# Patient Record
Sex: Male | Born: 1970 | Race: White | Hispanic: No | Smoking: Never smoker
Health system: Southern US, Community
[De-identification: ages and names within clinical notes are randomized; demographics above are authoritative.]

## PROBLEM LIST (undated history)

## (undated) DIAGNOSIS — I10 Essential (primary) hypertension: Secondary | ICD-10-CM

## (undated) HISTORY — PX: TONSILLECTOMY AND ADENOIDECTOMY: SUR1326

---

## 2011-12-27 ENCOUNTER — Encounter (HOSPITAL_COMMUNITY): Payer: Self-pay | Admitting: Family Medicine

## 2011-12-27 ENCOUNTER — Other Ambulatory Visit: Payer: Self-pay

## 2011-12-27 ENCOUNTER — Emergency Department (HOSPITAL_COMMUNITY): Payer: Self-pay

## 2011-12-27 ENCOUNTER — Inpatient Hospital Stay (HOSPITAL_COMMUNITY)
Admission: EM | Admit: 2011-12-27 | Discharge: 2011-12-30 | DRG: 419 | Disposition: A | Discharge status: Home / Self Care | Payer: Self-pay | Attending: General Surgery | Admitting: General Surgery

## 2011-12-27 DIAGNOSIS — Z6833 Body mass index (BMI) 33.0-33.9, adult: Secondary | ICD-10-CM

## 2011-12-27 DIAGNOSIS — K8000 Calculus of gallbladder with acute cholecystitis without obstruction: Principal | ICD-10-CM

## 2011-12-27 LAB — COMPREHENSIVE METABOLIC PANEL
Alkaline Phosphatase: 83 U/L (ref 39–117)
BUN: 13 mg/dL (ref 6–23)
GFR calc Af Amer: >90 mL/min (ref >90)
GFR calc non Af Amer: 85 mL/min — ABNORMAL LOW (ref >90)
Glucose, Bld: 129 mg/dL — ABNORMAL HIGH (ref 70–99)
Potassium: 3.4 mEq/L — ABNORMAL LOW (ref 3.5–5.1)
Total Bilirubin: 1.5 mg/dL — ABNORMAL HIGH (ref 0.3–1.2)
Total Protein: 7.7 g/dL (ref 6.0–8.3)

## 2011-12-27 LAB — CBC
Hemoglobin: 17.9 g/dL — ABNORMAL HIGH (ref 13.0–17.0)
MCH: 30.4 pg (ref 26.0–34.0)
MCV: 82.5 fL (ref 78.0–100.0)
RBC: 5.88 MIL/uL — ABNORMAL HIGH (ref 4.22–5.81)

## 2011-12-27 LAB — LIPASE, BLOOD: Lipase: 14 U/L (ref 11–59)

## 2011-12-27 LAB — DIFFERENTIAL
Eosinophils Absolute: 0 10*3/uL (ref 0.0–0.7)
Lymphs Abs: 1.3 10*3/uL (ref 0.7–4.0)
Monocytes Relative: 6 % (ref 3–12)
Neutrophils Relative %: 88 % — ABNORMAL HIGH (ref 43–77)

## 2011-12-27 MED ORDER — HYDROMORPHONE HCL PF 1 MG/ML IJ SOLN
1.0000 mg | Freq: Once | INTRAMUSCULAR | Status: AC
  Administered 2011-12-27: 1 mg via INTRAVENOUS
  Filled 2011-12-27: qty 1

## 2011-12-27 MED ORDER — SODIUM CHLORIDE 0.9 % IV BOLUS (SEPSIS)
1000.0000 mL | Freq: Once | INTRAVENOUS | Status: AC
  Administered 2011-12-27: 1000 mL via INTRAVENOUS

## 2011-12-27 MED ORDER — ONDANSETRON HCL 4 MG/2ML IJ SOLN
4.0000 mg | Freq: Once | INTRAMUSCULAR | Status: AC
  Administered 2011-12-27: 4 mg via INTRAVENOUS
  Filled 2011-12-27: qty 2

## 2011-12-27 NOTE — ED Notes (Signed)
Pt complaining of upper back pain and chest pain that started last night after eating some chili. sts now RUQ pain. Denies N,V,D.

## 2011-12-27 NOTE — ED Provider Notes (Signed)
History     CSN: [id number]  Arrival date & time 12/27/11  2130   First MD Initiated Contact with Patient 12/27/11 2103      Chief Complaint  Patient presents with  . Abdominal Pain  . Chest Pain  . Back Pain    (Consider location/radiation/quality/duration/timing/severity/associated sxs/prior treatment) HPI Comments: Patient presents with right upper quadrant pain has been constant throughout the day.  It is sharp and stabbing does not radiate. Denies any nausea, vomiting, diarrhea, fever. 2 nights ago he woke up with upper back pain and epigastric pain he attributes to eating some chili. Those pains are now resolved. He denies any previous medical problems and has no cardiac history.  The history is provided by the patient.    History reviewed. No pertinent past medical history.  Past Surgical History  Procedure Date  . Tonsillectomy and adenoidectomy     History reviewed. No pertinent family history.  History  Substance Use Topics  . Smoking status: Never Smoker   . Smokeless tobacco: Not on file  . Alcohol Use: 0.5 oz/week    1 drink(s) per week      Review of Systems  Constitutional: Positive for activity change. Negative for fever.  HENT: Negative for congestion and rhinorrhea.   Respiratory: Negative for cough, chest tightness and shortness of breath.   Cardiovascular: Positive for chest pain.  Gastrointestinal: Positive for abdominal pain. Negative for nausea, vomiting and diarrhea.  Genitourinary: Negative for dysuria and hematuria.  Musculoskeletal: Positive for back pain.  Skin: Negative for rash.  Neurological: Negative for weakness, light-headedness and headaches.    Allergies  Review of patient's allergies indicates no known allergies.  Home Medications  No current outpatient prescriptions on file.  BP 127/74  Pulse 96  Temp(Src) 100.5 F (38.1 C) (Oral)  Resp 18  Ht 6' (1.829 m)  Wt 250 lb (113.399 kg)  BMI 33.91 kg/m2  SpO2  96%  Physical Exam  Constitutional: He is oriented to person, place, and time. He appears well-developed and well-nourished. He appears distressed.  HENT:  Head: Normocephalic and atraumatic.  Mouth/Throat: Oropharynx is clear and moist. No oropharyngeal exudate.  Eyes: Conjunctivae and EOM are normal. Pupils are equal, round, and reactive to light.  Neck: Normal range of motion. Neck supple.  Cardiovascular: Normal rate, regular rhythm and normal heart sounds.   Pulmonary/Chest: Effort normal and breath sounds normal. No respiratory distress.  Abdominal: Soft. There is tenderness. There is rebound and guarding.       Right upper quadrant tenderness with guarding and rebound  Musculoskeletal: Normal range of motion. He exhibits no edema and no tenderness.  Neurological: He is alert and oriented to person, place, and time. No cranial nerve deficit.  Skin: Skin is warm.    ED Course  Procedures (including critical care time)  Labs Reviewed  CBC - Abnormal; Notable for the following:    WBC 23.7 (*)    RBC 5.88 (*)    Hemoglobin 17.9 (*)    MCHC 36.9 (*)    All other components within normal limits  DIFFERENTIAL - Abnormal; Notable for the following:    Neutrophils Relative 88 (*)    Neutro Abs 21.0 (*)    Lymphocytes Relative 5 (*)    Monocytes Absolute 1.5 (*)    All other components within normal limits  COMPREHENSIVE METABOLIC PANEL - Abnormal; Notable for the following:    Potassium 3.4 (*)    Glucose, Bld 129 (*)  Total Bilirubin 1.5 (*)    GFR calc non Af Amer 85 (*)    All other components within normal limits  D-DIMER, QUANTITATIVE - Abnormal; Notable for the following:    D-Dimer, Quant 0.59 (*)    All other components within normal limits  LIPASE, BLOOD  TROPONIN I  SURGICAL PCR SCREEN  COMPREHENSIVE METABOLIC PANEL  CBC   Dg Chest 2 View  12/27/2011  *RADIOLOGY REPORT*  Clinical Data: Right lower chest pain, especially on inspiration.  CHEST - 2 VIEW   Comparison: None.  Findings: The lungs are hypoexpanded; mild bibasilar opacities may reflect atelectasis or possibly pneumonia.  There is no evidence of pleural effusion or pneumothorax.  The heart is normal in size; the mediastinal contour is within normal limits.  No acute osseous abnormalities are seen.  IMPRESSION: Lungs significantly hypoexpanded; mild basilar opacities may reflect atelectasis or possibly pneumonia.  Original Report Authenticated By: Tonia Ghent, M.D.   US Abdomen Complete  12/28/2011  *RADIOLOGY REPORT*  Clinical Data:  Chest, abdomen and back pain.  COMPLETE ABDOMINAL ULTRASOUND  Comparison:  None.  Findings:  Gallbladder:  2.6 cm gallstone lodged in the gallbladder neck. Mild to moderate diffuse gallbladder wall thickening with a maximum thickness of 5.5 mm.  The patient was not tender over the gallbladder.  Common bile duct:  Poorly visualized, measuring 5.5 mm in maximum diameter proximally.  Liver:  Mildly hyperechoic and mildly heterogeneous.  IVC:  Poorly visualized.  Grossly normal proximally.  Pancreas:  Not visualized.  Spleen:  Normal, measuring 8.2 cm in length.  Right Kidney:  Normal, measuring 11.9 cm in length.  Left Kidney:  Normal, measuring 12.8 cm in length.  Abdominal aorta:  Obscured by overlying bowel gas.  IMPRESSION:  1.  Cholelithiasis. 2.  Mild diffuse gallbladder wall thickening.  In the absence of tenderness over the gallbladder, this is most likely due to chronic cholecystitis. 3.  Non-visualized pancreas and abdominal aorta.  Original Report Authenticated By: Darrol Angel, M.D.     No diagnosis found.    MDM  RUQ pain with leukocytosis.  Very uncomfortable with guarding and rebound.  Elevated WBC count.  LFTs wnl. [place] with multiple stones and GB wall thickening. Discussed with Dr. Andrey Campanile who will evaluate patient.     Glynn Octave, MD 12/28/11 1041

## 2011-12-27 NOTE — ED Notes (Signed)
Pt states he would like something to drink to be able to pee. Unable to give urine sample at this time

## 2011-12-28 ENCOUNTER — Inpatient Hospital Stay (HOSPITAL_COMMUNITY): Payer: Self-pay

## 2011-12-28 ENCOUNTER — Encounter (HOSPITAL_COMMUNITY): Payer: Self-pay | Admitting: Anesthesiology

## 2011-12-28 ENCOUNTER — Inpatient Hospital Stay (HOSPITAL_COMMUNITY): Payer: Self-pay | Admitting: Anesthesiology

## 2011-12-28 ENCOUNTER — Encounter (HOSPITAL_COMMUNITY): Payer: Self-pay | Admitting: General Surgery

## 2011-12-28 ENCOUNTER — Encounter (HOSPITAL_COMMUNITY): Admission: EM | Disposition: A | Discharge status: Home / Self Care | Payer: Self-pay | Source: Home / Self Care

## 2011-12-28 ENCOUNTER — Other Ambulatory Visit (INDEPENDENT_AMBULATORY_CARE_PROVIDER_SITE_OTHER): Payer: Self-pay | Admitting: General Surgery

## 2011-12-28 DIAGNOSIS — R112 Nausea with vomiting, unspecified: Secondary | ICD-10-CM

## 2011-12-28 DIAGNOSIS — K8000 Calculus of gallbladder with acute cholecystitis without obstruction: Secondary | ICD-10-CM | POA: Diagnosis present

## 2011-12-28 DIAGNOSIS — K810 Acute cholecystitis: Secondary | ICD-10-CM

## 2011-12-28 DIAGNOSIS — R1011 Right upper quadrant pain: Secondary | ICD-10-CM

## 2011-12-28 HISTORY — PX: CHOLECYSTECTOMY: SHX55

## 2011-12-28 LAB — SURGICAL PCR SCREEN: Staphylococcus aureus: NEGATIVE

## 2011-12-28 LAB — COMPREHENSIVE METABOLIC PANEL
ALT: 35 U/L (ref 0–53)
Alkaline Phosphatase: 93 U/L (ref 39–117)
CO2: 21 mEq/L (ref 19–32)
GFR calc Af Amer: >90 mL/min (ref >90)
Glucose, Bld: 98 mg/dL (ref 70–99)
Potassium: 3.7 mEq/L (ref 3.5–5.1)
Sodium: 139 mEq/L (ref 135–145)
Total Protein: 7.3 g/dL (ref 6.0–8.3)

## 2011-12-28 LAB — CBC
HCT: 50 % (ref 39.0–52.0)
MCH: 28.8 pg (ref 26.0–34.0)
MCHC: 34 g/dL (ref 30.0–36.0)
RDW: 14.5 % (ref 11.5–15.5)

## 2011-12-28 SURGERY — LAPAROSCOPIC CHOLECYSTECTOMY WITH INTRAOPERATIVE CHOLANGIOGRAM
Anesthesia: General | Site: Abdomen | Wound class: Clean Contaminated

## 2011-12-28 MED ORDER — LACTATED RINGERS IV SOLN
INTRAVENOUS | Status: DC | PRN
  Administered 2011-12-28: 10:00:00 via INTRAVENOUS

## 2011-12-28 MED ORDER — FENTANYL CITRATE 0.05 MG/ML IJ SOLN
INTRAMUSCULAR | Status: DC | PRN
  Administered 2011-12-28 (×2): 50 ug via INTRAVENOUS
  Administered 2011-12-28: 150 ug via INTRAVENOUS

## 2011-12-28 MED ORDER — BIOTENE DRY MOUTH MT LIQD
15.0000 mL | Freq: Two times a day (BID) | OROMUCOSAL | Status: DC
  Administered 2011-12-28: 15 mL via OROMUCOSAL

## 2011-12-28 MED ORDER — CHLORHEXIDINE GLUCONATE 0.12 % MT SOLN
15.0000 mL | Freq: Two times a day (BID) | OROMUCOSAL | Status: DC
  Administered 2011-12-28: 15 mL via OROMUCOSAL

## 2011-12-28 MED ORDER — HYDROMORPHONE HCL PF 1 MG/ML IJ SOLN
0.2500 mg | INTRAMUSCULAR | Status: DC | PRN
  Administered 2011-12-28 (×4): 0.5 mg via INTRAVENOUS

## 2011-12-28 MED ORDER — ONDANSETRON HCL 4 MG/2ML IJ SOLN
INTRAMUSCULAR | Status: DC | PRN
  Administered 2011-12-28: 4 mg via INTRAVENOUS

## 2011-12-28 MED ORDER — 0.9 % SODIUM CHLORIDE (POUR BTL) OPTIME
TOPICAL | Status: DC | PRN
  Administered 2011-12-28: 1000 mL

## 2011-12-28 MED ORDER — ROCURONIUM BROMIDE 100 MG/10ML IV SOLN
INTRAVENOUS | Status: DC | PRN
  Administered 2011-12-28: 50 mg via INTRAVENOUS

## 2011-12-28 MED ORDER — KCL IN DEXTROSE-NACL 20-5-0.45 MEQ/L-%-% IV SOLN
INTRAVENOUS | Status: DC
  Administered 2011-12-28 – 2011-12-29 (×4): via INTRAVENOUS
  Filled 2011-12-28 (×8): qty 1000

## 2011-12-28 MED ORDER — LIDOCAINE-EPINEPHRINE 1 %-1:100000 IJ SOLN
INTRAMUSCULAR | Status: DC | PRN
  Administered 2011-12-28: 30 mL

## 2011-12-28 MED ORDER — HEPARIN SODIUM (PORCINE) 5000 UNIT/ML IJ SOLN
5000.0000 [IU] | Freq: Three times a day (TID) | INTRAMUSCULAR | Status: DC
  Administered 2011-12-28 – 2011-12-30 (×5): 5000 [IU] via SUBCUTANEOUS
  Filled 2011-12-28 (×8): qty 1

## 2011-12-28 MED ORDER — HYDROMORPHONE HCL PF 1 MG/ML IJ SOLN
INTRAMUSCULAR | Status: AC
  Filled 2011-12-28: qty 1

## 2011-12-28 MED ORDER — ONDANSETRON HCL 4 MG/2ML IJ SOLN: 4.0000 mg | Freq: Four times a day (QID) | INTRAMUSCULAR | Status: DC | PRN

## 2011-12-28 MED ORDER — BUPIVACAINE HCL (PF) 0.25 % IJ SOLN
INTRAMUSCULAR | Status: DC | PRN
  Administered 2011-12-28: 30 mL

## 2011-12-28 MED ORDER — GLYCOPYRROLATE 0.2 MG/ML IJ SOLN
INTRAMUSCULAR | Status: DC | PRN
  Administered 2011-12-28: .6 mg via INTRAVENOUS

## 2011-12-28 MED ORDER — IOHEXOL 300 MG/ML  SOLN
INTRAMUSCULAR | Status: DC | PRN
  Administered 2011-12-28: 50 mL via INTRAVENOUS

## 2011-12-28 MED ORDER — MORPHINE SULFATE 2 MG/ML IJ SOLN
2.0000 mg | INTRAMUSCULAR | Status: DC | PRN
  Administered 2011-12-28: 4 mg via INTRAVENOUS
  Administered 2011-12-28: 2 mg via INTRAVENOUS
  Administered 2011-12-28: 4 mg via INTRAVENOUS
  Administered 2011-12-28: 2 mg via INTRAVENOUS
  Administered 2011-12-28 (×3): 4 mg via INTRAVENOUS
  Administered 2011-12-29 (×2): 2 mg via INTRAVENOUS
  Filled 2011-12-28 (×2): qty 1
  Filled 2011-12-28 (×4): qty 2
  Filled 2011-12-28: qty 1
  Filled 2011-12-28: qty 2
  Filled 2011-12-28: qty 1

## 2011-12-28 MED ORDER — MIDAZOLAM HCL 5 MG/5ML IJ SOLN
INTRAMUSCULAR | Status: DC | PRN
  Administered 2011-12-28: 2 mg via INTRAVENOUS

## 2011-12-28 MED ORDER — PANTOPRAZOLE SODIUM 40 MG IV SOLR
40.0000 mg | Freq: Every day | INTRAVENOUS | Status: DC
  Administered 2011-12-28 – 2011-12-29 (×2): 40 mg via INTRAVENOUS
  Filled 2011-12-28 (×3): qty 40

## 2011-12-28 MED ORDER — PIPERACILLIN-TAZOBACTAM 3.375 G IVPB
3.3750 g | Freq: Three times a day (TID) | INTRAVENOUS | Status: DC
  Administered 2011-12-28 (×2): 3.375 g via INTRAVENOUS
  Filled 2011-12-28 (×3): qty 50

## 2011-12-28 MED ORDER — SODIUM CHLORIDE 0.9 % IR SOLN
Status: DC | PRN
  Administered 2011-12-28: 1000 mL

## 2011-12-28 MED ORDER — SUCCINYLCHOLINE CHLORIDE 20 MG/ML IJ SOLN
INTRAMUSCULAR | Status: DC | PRN
  Administered 2011-12-28: 100 mg via INTRAVENOUS

## 2011-12-28 MED ORDER — HYDROMORPHONE HCL PF 1 MG/ML IJ SOLN
1.0000 mg | Freq: Once | INTRAMUSCULAR | Status: AC
  Administered 2011-12-28: 1 mg via INTRAVENOUS
  Filled 2011-12-28: qty 1

## 2011-12-28 MED ORDER — HYDROCODONE-ACETAMINOPHEN 5-325 MG PO TABS
1.0000 | ORAL_TABLET | ORAL | Status: DC | PRN
  Administered 2011-12-29 (×3): 2 via ORAL
  Administered 2011-12-30: 1 via ORAL
  Filled 2011-12-28 (×2): qty 2
  Filled 2011-12-28: qty 1
  Filled 2011-12-28: qty 2

## 2011-12-28 MED ORDER — ONDANSETRON HCL 4 MG/2ML IJ SOLN: 4.0000 mg | Freq: Once | INTRAMUSCULAR | Status: AC | PRN

## 2011-12-28 MED ORDER — CIPROFLOXACIN IN D5W 400 MG/200ML IV SOLN
400.0000 mg | Freq: Two times a day (BID) | INTRAVENOUS | Status: DC
  Administered 2011-12-28 – 2011-12-30 (×4): 400 mg via INTRAVENOUS
  Filled 2011-12-28 (×5): qty 200

## 2011-12-28 MED ORDER — NEOSTIGMINE METHYLSULFATE 1 MG/ML IJ SOLN
INTRAMUSCULAR | Status: DC | PRN
  Administered 2011-12-28: 4 mg via INTRAVENOUS

## 2011-12-28 MED ORDER — KCL IN DEXTROSE-NACL 20-5-0.45 MEQ/L-%-% IV SOLN
INTRAVENOUS | Status: AC
  Filled 2011-12-28: qty 1000

## 2011-12-28 MED ORDER — PROPOFOL 10 MG/ML IV EMUL
INTRAVENOUS | Status: DC | PRN
  Administered 2011-12-28: 180 mg via INTRAVENOUS

## 2011-12-28 SURGICAL SUPPLY — 52 items
APPLIER CLIP ROT 10 11.4 M/L (STAPLE) ×2
BLADE SURG ROTATE 9660 (MISCELLANEOUS) IMPLANT
CANISTER SUCTION 2500CC (MISCELLANEOUS) ×2 IMPLANT
CATH REDDICK CHOLANGI 4FR 50CM (CATHETERS) ×2 IMPLANT
CHLORAPREP W/TINT 26ML (MISCELLANEOUS) ×2 IMPLANT
CLIP APPLIE ROT 10 11.4 M/L (STAPLE) ×1 IMPLANT
CLOTH BEACON ORANGE TIMEOUT ST (SAFETY) ×2 IMPLANT
COVER SURGICAL LIGHT HANDLE (MISCELLANEOUS) ×2 IMPLANT
DECANTER SPIKE VIAL GLASS SM (MISCELLANEOUS) ×4 IMPLANT
DERMABOND ADVANCED (GAUZE/BANDAGES/DRESSINGS) ×1
DERMABOND ADVANCED .7 DNX12 (GAUZE/BANDAGES/DRESSINGS) ×1 IMPLANT
DRAIN CHANNEL 19F RND (DRAIN) ×2 IMPLANT
DRAPE C-ARM 42X72 X-RAY (DRAPES) ×2 IMPLANT
ELECT CAUTERY BLADE 6.4 (BLADE) ×2 IMPLANT
ELECT REM PT RETURN 9FT ADLT (ELECTROSURGICAL) ×2
ELECTRODE REM PT RTRN 9FT ADLT (ELECTROSURGICAL) ×1 IMPLANT
EVACUATOR SILICONE 100CC (DRAIN) ×2 IMPLANT
GLOVE BIO SURGEON STRL SZ7.5 (GLOVE) ×2 IMPLANT
GLOVE BIOGEL PI IND STRL 7.0 (GLOVE) ×2 IMPLANT
GLOVE BIOGEL PI IND STRL 7.5 (GLOVE) ×1 IMPLANT
GLOVE BIOGEL PI IND STRL 8 (GLOVE) ×1 IMPLANT
GLOVE BIOGEL PI INDICATOR 7.0 (GLOVE) ×2
GLOVE BIOGEL PI INDICATOR 7.5 (GLOVE) ×1
GLOVE BIOGEL PI INDICATOR 8 (GLOVE) ×1
GLOVE ECLIPSE 6.5 STRL STRAW (GLOVE) ×2 IMPLANT
GLOVE SS BIOGEL STRL SZ 8 (GLOVE) ×1 IMPLANT
GLOVE SUPERSENSE BIOGEL SZ 8 (GLOVE) ×1
GLOVE SURG SS PI 6.5 STRL IVOR (GLOVE) ×2 IMPLANT
GLOVE SURG SS PI 7.5 STRL IVOR (GLOVE) ×4 IMPLANT
GOWN PREVENTION PLUS XLARGE (GOWN DISPOSABLE) ×4 IMPLANT
GOWN STRL NON-REIN LRG LVL3 (GOWN DISPOSABLE) ×6 IMPLANT
IV CATH 14GX2 1/4 (CATHETERS) ×2 IMPLANT
KIT BASIN OR (CUSTOM PROCEDURE TRAY) ×2 IMPLANT
KIT ROOM TURNOVER OR (KITS) ×2 IMPLANT
NS IRRIG 1000ML POUR BTL (IV SOLUTION) ×2 IMPLANT
PAD ARMBOARD 7.5X6 YLW CONV (MISCELLANEOUS) ×2 IMPLANT
PENCIL BUTTON HOLSTER BLD 10FT (ELECTRODE) ×2 IMPLANT
POUCH SPECIMEN RETRIEVAL 10MM (ENDOMECHANICALS) ×2 IMPLANT
SCISSORS LAP 5X35 DISP (ENDOMECHANICALS) IMPLANT
SET IRRIG TUBING LAPAROSCOPIC (IRRIGATION / IRRIGATOR) ×2 IMPLANT
SLEEVE ENDOPATH XCEL 5M (ENDOMECHANICALS) ×2 IMPLANT
SPECIMEN JAR SMALL (MISCELLANEOUS) ×2 IMPLANT
SUT ETHILON 2 0 FS 18 (SUTURE) ×2 IMPLANT
SUT MNCRL AB 4-0 PS2 18 (SUTURE) ×4 IMPLANT
SUT VICRYL 0 UR6 27IN ABS (SUTURE) ×4 IMPLANT
TOWEL OR 17X24 6PK STRL BLUE (TOWEL DISPOSABLE) ×2 IMPLANT
TOWEL OR 17X26 10 PK STRL BLUE (TOWEL DISPOSABLE) ×2 IMPLANT
TRAY FOLEY CATH 14FR (SET/KITS/TRAYS/PACK) ×2 IMPLANT
TRAY LAPAROSCOPIC (CUSTOM PROCEDURE TRAY) ×2 IMPLANT
TROCAR HASSON GELL 12X100 (TROCAR) ×2 IMPLANT
TROCAR XCEL NON-BLD 11X100MML (ENDOMECHANICALS) ×2 IMPLANT
TROCAR XCEL NON-BLD 5MMX100MML (ENDOMECHANICALS) ×2 IMPLANT

## 2011-12-28 NOTE — ED Notes (Signed)
Pt o2 saturation 88-91 on RA. [place] at 2L/min placed on pt. O2 saturation raised to 94%

## 2011-12-28 NOTE — H&P (Addendum)
Joshua Long is an 41 y.o. male.   Chief Complaint: abdominal pain HPI: 41 year old Caucasian man presents to the emergency room complaining of abdominal pain since Saturday morning. He states that the pain initially started in his epigastric area but later moved to his right upper quadrant. The pain in his upper abdomen gradually eased off over several hours; however, the pain in his right upper side has been constant ever since yesterday afternoon. He describes it as a sharp stabbing pain. He denies any previous symptoms. He states that on Friday he cooked an extra hot batch of chili and he developed some mild upper bowel pain after eating a bowl. However it resolved. He states that he hasn't had much of an appetite today. He describes the pain as 8/10. He denies any fevers or chills. He did have some nausea yesterday morning but no vomiting. He denies any diarrhea, constipation, melena, hematochezia, or acholic stools. He denies any difficulty swallowing. He tried taking some Tylenol and drinking a glass of red wine. Neither of which reduced his abdominal pain.  He denies any significant medical history.  History reviewed. No pertinent past medical history.  Past Surgical History  Procedure Date  . Tonsillectomy and adenoidectomy     History reviewed. No pertinent family history. Social History:  reports that he has never smoked. He does not have any smokeless tobacco history on file. He reports that he drinks about .5 ounces of alcohol per week. He reports that he does not use illicit drugs.  Allergies: No Known Allergies  Medications Prior to Admission  Medication Dose Route Frequency Provider Last Rate Last Dose  . HYDROmorphone (DILAUDID) injection 1 mg  1 mg Intravenous Once Glynn Octave, MD   1 mg at 12/27/11 2117  . HYDROmorphone (DILAUDID) injection 1 mg  1 mg Intravenous Once Glynn Octave, MD   1 mg at 12/27/11 2250  . HYDROmorphone (DILAUDID) injection 1 mg  1 mg Intravenous  Once Glynn Octave, MD   1 mg at 12/28/11 0044  . ondansetron (ZOFRAN) injection 4 mg  4 mg Intravenous Once Glynn Octave, MD   4 mg at 12/27/11 2117  . sodium chloride 0.9 % bolus 1,000 mL  1,000 mL Intravenous Once Glynn Octave, MD   1,000 mL at 12/27/11 2203   No current outpatient prescriptions on file as of 12/27/2011.    Results for orders placed during the hospital encounter of 12/27/11 (from the past 48 hour(s))  CBC     Status: Abnormal   Collection Time   12/27/11  9:06 PM      Component Value Range Comment   WBC 23.7 (*) 4.0 - 10.5 (K/uL)    RBC 5.88 (*) 4.22 - 5.81 (MIL/uL)    Hemoglobin 17.9 (*) 13.0 - 17.0 (g/dL)    HCT 40.9  81.1 - 91.4 (%)    MCV 82.5  78.0 - 100.0 (fL)    MCH 30.4  26.0 - 34.0 (pg)    MCHC 36.9 (*) 30.0 - 36.0 (g/dL)    RDW 78.2  95.6 - 21.3 (%)    Platelets 164  150 - 400 (K/uL)   DIFFERENTIAL     Status: Abnormal   Collection Time   12/27/11  9:06 PM      Component Value Range Comment   Neutrophils Relative 88 (*) 43 - 77 (%)    Neutro Abs 21.0 (*) 1.7 - 7.7 (K/uL)    Lymphocytes Relative 5 (*) 12 - 46 (%)  Lymphs Abs 1.3  0.7 - 4.0 (K/uL)    Monocytes Relative 6  3 - 12 (%)    Monocytes Absolute 1.5 (*) 0.1 - 1.0 (K/uL)    Eosinophils Relative 0  0 - 5 (%)    Eosinophils Absolute 0.0  0.0 - 0.7 (K/uL)    Basophils Relative 0  0 - 1 (%)    Basophils Absolute 0.0  0.0 - 0.1 (K/uL)   COMPREHENSIVE METABOLIC PANEL     Status: Abnormal   Collection Time   12/27/11  9:06 PM      Component Value Range Comment   Sodium 135  135 - 145 (mEq/L)    Potassium 3.4 (*) 3.5 - 5.1 (mEq/L)    Chloride 99  96 - 112 (mEq/L)    CO2 23  19 - 32 (mEq/L)    Glucose, Bld 129 (*) 70 - 99 (mg/dL)    BUN 13  6 - 23 (mg/dL)    Creatinine, Ser 9.60  0.50 - 1.35 (mg/dL)    Calcium 9.8  8.4 - 10.5 (mg/dL)    Total Protein 7.7  6.0 - 8.3 (g/dL)    Albumin 4.0  3.5 - 5.2 (g/dL)    AST 21  0 - 37 (U/L)    ALT 38  0 - 53 (U/L)    Alkaline Phosphatase 83  39 -  117 (U/L)    Total Bilirubin 1.5 (*) 0.3 - 1.2 (mg/dL)    GFR calc non Af Amer 85 (*) >90 (mL/min)    GFR calc Af Amer >90  >90 (mL/min)   LIPASE, BLOOD     Status: Normal   Collection Time   12/27/11  9:06 PM      Component Value Range Comment   Lipase 14  11 - 59 (U/L)   TROPONIN I     Status: Normal   Collection Time   12/27/11  9:06 PM      Component Value Range Comment   Troponin I <0.30  <0.30 (ng/mL)   D-DIMER, QUANTITATIVE     Status: Abnormal   Collection Time   12/27/11  9:28 PM      Component Value Range Comment   D-Dimer, Quant 0.59 (*) 0.00 - 0.48 (ug/mL-FEU)    Dg Chest 2 View  12/27/2011  *RADIOLOGY REPORT*  Clinical Data: Right lower chest pain, especially on inspiration.  CHEST - 2 VIEW  Comparison: None.  Findings: The lungs are hypoexpanded; mild bibasilar opacities may reflect atelectasis or possibly pneumonia.  There is no evidence of pleural effusion or pneumothorax.  The heart is normal in size; the mediastinal contour is within normal limits.  No acute osseous abnormalities are seen.  IMPRESSION: Lungs significantly hypoexpanded; mild basilar opacities may reflect atelectasis or possibly pneumonia.  Original Report Authenticated By: Tonia Ghent, M.D.   US Abdomen Complete  12/28/2011  *RADIOLOGY REPORT*  Clinical Data:  Chest, abdomen and back pain.  COMPLETE ABDOMINAL ULTRASOUND  Comparison:  None.  Findings:  Gallbladder:  2.6 cm gallstone lodged in the gallbladder neck. Mild to moderate diffuse gallbladder wall thickening with a maximum thickness of 5.5 mm.  The patient was not tender over the gallbladder.  Common bile duct:  Poorly visualized, measuring 5.5 mm in maximum diameter proximally.  Liver:  Mildly hyperechoic and mildly heterogeneous.  IVC:  Poorly visualized.  Grossly normal proximally.  Pancreas:  Not visualized.  Spleen:  Normal, measuring 8.2 cm in length.  Right Kidney:  Normal, measuring 11.9 cm  in length.  Left Kidney:  Normal, measuring 12.8 cm in  length.  Abdominal aorta:  Obscured by overlying bowel gas.  IMPRESSION:  1.  Cholelithiasis. 2.  Mild diffuse gallbladder wall thickening.  In the absence of tenderness over the gallbladder, this is most likely due to chronic cholecystitis. 3.  Non-visualized pancreas and abdominal aorta.  Original Report Authenticated By: Darrol Angel, M.D.    Review of Systems  Constitutional: Negative for fever, chills and weight loss.  HENT: Negative for hearing loss and neck pain.   Eyes: Negative for double vision.  Respiratory: Negative for shortness of breath.   Cardiovascular: Negative for chest pain, palpitations, orthopnea and leg swelling.  Gastrointestinal: Heartburn: see hpi.  Genitourinary: Negative for dysuria and urgency.  Musculoskeletal: Negative for back pain.  Skin: Negative for rash.  Neurological: Negative for dizziness, seizures, loss of consciousness, weakness and headaches.  Endo/Heme/Allergies: Negative for polydipsia.  Psychiatric/Behavioral: Negative for substance abuse.    Blood pressure 126/78, pulse 78, temperature 98.3 F (36.8 C), temperature source Oral, resp. rate 18, SpO2 99.00%. Physical Exam  Vitals reviewed. Constitutional: He is oriented to person, place, and time. He appears well-developed and well-nourished. No distress.  HENT:  Head: Normocephalic and atraumatic.  Right Ear: External ear normal.  Left Ear: External ear normal.  Nose: Nose normal.  Eyes: Conjunctivae are normal. No scleral icterus.  Neck: Normal range of motion. Neck supple. No tracheal deviation present. No thyromegaly present.  Cardiovascular: Normal rate, regular rhythm, normal heart sounds and intact distal pulses.   Respiratory: Effort normal and breath sounds normal. No respiratory distress. He has no wheezes.  GI: Soft. Bowel sounds are normal. He exhibits no distension. There is tenderness (RUQ). There is no rebound and no guarding.  Musculoskeletal: Normal range of motion. He  exhibits no edema and no tenderness.  Lymphadenopathy:    He has no cervical adenopathy.  Neurological: He is alert and oriented to person, place, and time.  Skin: Skin is warm and dry. No rash noted. No erythema.  Psychiatric: He has a normal mood and affect. His behavior is normal. Judgment and thought content normal.     Assessment/Plan Patient Active Problem List  Diagnoses  . Gallbladder calculus with acute cholecystitis  hypokalemia  Admit for IVF, IV abx, potassium replacement Plan Lap chole, possible open Monday by Dr Biagio Quint.  We discussed gallbladder disease. The patient was given Agricultural engineer. We discussed non-operative and operative management.   I discussed laparoscopic cholecystectomy in detail.  The patient was given diagrams detailing the procedure.  We discussed the risks and benefits of a laparoscopic cholecystectomy including, but not limited to bleeding, infection, injury to surrounding structures such as the intestine or liver, bile leak, retained gallstones, need to convert to an open procedure, prolonged diarrhea, blood clots such as  DVT, common bile duct injury, anesthesia risks, and possible need for additional procedures.  We discussed the typical post-operative recovery course. I explained that the likelihood of improvement of their symptoms is good.  Mary Sella. Andrey Campanile, MD, FACS General, Bariatric, & Minimally Invasive Surgery [facility], [place]    [facility] M 12/28/2011, 1:47 AM  Pt seen and evaluated on the surgical ward.  He has a history and exam consistent with acute cholecystitis.  He has focal RUQ tenderness with [place] concerning for cholecystitis.  WBC 24k.  I discussed the procedure and the alternatives with the patient.  The risks of infection, bleeding, pain, persistent symptoms, scarring, injury to  bowel or bile ducts, retained stone, diarrhea, need for additional procedures, and need for open surgery discussed with the patient. He  desires to proceed with lap chole with IOC.

## 2011-12-28 NOTE — Anesthesia Procedure Notes (Signed)
Procedure Name: Intubation Date/Time: 12/28/2011 10:55 AM Performed by: Leona Singleton A. Patient Re-evaluated:Patient Re-evaluated prior to inductionOxygen Delivery Method: Circle System Utilized Preoxygenation: Pre-oxygenation with 100% oxygen Intubation Type: IV induction, Rapid sequence and Cricoid Pressure applied Laryngoscope Size: Miller and 2 Grade View: Grade I Tube type: Oral Tube size: 7.5 mm Number of attempts: 1 Airway Equipment and Method: stylet Placement Confirmation: ETT inserted through vocal cords under direct vision,  positive ETCO2 and breath sounds checked- equal and bilateral Secured at: 21 cm Tube secured with: Tape Dental Injury: Teeth and Oropharynx as per pre-operative assessment

## 2011-12-28 NOTE — Preoperative (Signed)
Beta Blockers   Reason not to administer Beta Blockers:Not Applicable 

## 2011-12-28 NOTE — Anesthesia Preprocedure Evaluation (Addendum)
Anesthesia Evaluation  Patient identified by MRN, date of birth, ID band Patient awake    Reviewed: Allergy & Precautions, H&P , NPO status , Patient's Chart, lab work & pertinent test results  History of Anesthesia Complications Negative for: history of anesthetic complications  Airway Mallampati: II TM Distance: >3 FB Neck ROM: Full    Dental  (+) Teeth Intact and Dental Advisory Given   Pulmonary neg pulmonary ROS,  clear to auscultation        Cardiovascular Exercise Tolerance: Good neg cardio ROS Regular Normal    Neuro/Psych Negative Neurological ROS  Negative Psych ROS   GI/Hepatic negative GI ROS, Neg liver ROS,   Endo/Other  Negative Endocrine ROS  Renal/GU negative Renal ROS  Genitourinary negative   Musculoskeletal negative musculoskeletal ROS (+)   Abdominal (+) obese,  Abdomen: tender.    Peds negative pediatric ROS (+)  Hematology negative hematology ROS (+)   Anesthesia Other Findings   Reproductive/Obstetrics                        Anesthesia Physical Anesthesia Plan  ASA: II  Anesthesia Plan: General   Post-op Pain Management:    Induction: Intravenous  Airway Management Planned: Oral ETT  Additional Equipment:   Intra-op Plan:   Post-operative Plan: Extubation in OR  Informed Consent: I have reviewed the patients History and Physical, chart, labs and discussed the procedure including the risks, benefits and alternatives for the proposed anesthesia with the patient or authorized representative who has indicated his/her understanding and acceptance.   Dental advisory given  Plan Discussed with: Surgeon and CRNA  Anesthesia Plan Comments: (Choleliathiasis with biliary colic Obesity  Plan GA  Kipp Brood, MD)        Anesthesia Quick Evaluation

## 2011-12-28 NOTE — ED Notes (Signed)
Admitting MD at bedside.

## 2011-12-28 NOTE — Transfer of Care (Signed)
Immediate Anesthesia Transfer of Care Note  Patient: Joshua Long  Procedure(s) Performed: Procedure(s) (LRB): LAPAROSCOPIC CHOLECYSTECTOMY WITH INTRAOPERATIVE CHOLANGIOGRAM (N/A)  Patient Location: PACU  Anesthesia Type: General  Level of Consciousness: awake, alert , oriented and patient cooperative  Airway & Oxygen Therapy: Patient Spontanous Breathing and Patient connected to nasal cannula oxygen  Post-op Assessment: Report given to PACU RN, Post -op Vital signs reviewed and stable and Patient moving all extremities X 4  Post vital signs: Reviewed and stable  Complications: No apparent anesthesia complications

## 2011-12-28 NOTE — Anesthesia Postprocedure Evaluation (Signed)
  Anesthesia Post-op Note  Patient: Joshua Long  Procedure(s) Performed: Procedure(s) (LRB): LAPAROSCOPIC CHOLECYSTECTOMY WITH INTRAOPERATIVE CHOLANGIOGRAM (N/A)  Patient Location: PACU  Anesthesia Type: General  Level of Consciousness: awake, alert  and oriented  Airway and Oxygen Therapy: Patient Spontanous Breathing and Patient connected to nasal cannula oxygen  Post-op Pain: mild  Post-op Assessment: Post-op Vital signs reviewed and Patient's Cardiovascular Status Stable  Post-op Vital Signs: stable  Complications: No apparent anesthesia complications

## 2011-12-28 NOTE — Progress Notes (Signed)
Received from PACU post lap Cholecystectomy. Alert and oriented X 3, not in any distress, VSS, inciosn intact.

## 2011-12-28 NOTE — Brief Op Note (Signed)
12/27/2011 - 12/28/2011  12:25 PM  PATIENT:  Joshua Long  41 y.o. male  PRE-OPERATIVE DIAGNOSIS:  Gallstones.  POST-OPERATIVE DIAGNOSIS:  Gallstones.  PROCEDURE:  Procedure(s) (LRB): LAPAROSCOPIC CHOLECYSTECTOMY WITH INTRAOPERATIVE CHOLANGIOGRAM (N/A)  SURGEON:  Surgeon(s) and Role:    * Rulon Abide, DO - Primary  PHYSICIAN ASSISTANT:   ASSISTANTS: Brayton El   ANESTHESIA:   general  EBL:  Total I/O In: 1000 [I.V.:1000] Out: 150 [Urine:100; Blood:50]  BLOOD ADMINISTERED:none  DRAINS: (49F) Jackson-Pratt drain(s) with closed bulb suction in the GB fossa   LOCAL MEDICATIONS USED:  MARCAINE    and LIDOCAINE   SPECIMEN:  Source of Specimen:  gallbladder  DISPOSITION OF SPECIMEN:  PATHOLOGY  COUNTS:  YES  TOURNIQUET:  * No tourniquets in log *  DICTATION: .Other Dictation: Dictation Number [id number]  PLAN OF CARE: Admit to inpatient   PATIENT DISPOSITION:  PACU - hemodynamically stable.   Delay start of Pharmacological VTE agent (>24hrs) due to surgical blood loss or risk of bleeding: no

## 2011-12-28 NOTE — Op Note (Signed)
NAMEWILBORN, MEMBRENO NO.:  [id number]  MEDICAL RECORD NO.:  [id number]  LOCATION:  5158                         FACILITY:  [facility]  PHYSICIAN:  Lodema Pilot, MD       DATE OF BIRTH:  [birth year]  DATE OF PROCEDURE:  12/28/2011 DATE OF DISCHARGE:                              OPERATIVE REPORT   PROCEDURE:  Laparoscopic cholecystectomy with intraoperative cholangiogram and drain placement.  PREOPERATIVE DIAGNOSIS:  Acute cholecystitis.  POSTOPERATIVE DIAGNOSIS:  Acute cholecystitis.  SURGEON:  Lodema Pilot, MD  ASSISTANT:  Brayton El, PA-C  ANESTHESIA:  General endotracheal anesthesia with 26 mL of 1% lidocaine with epinephrine and 0.25% Marcaine in a 50:50 mixture.  FLUIDS:  1300 mL of crystalloid.  ESTIMATED BLOOD LOSS:  50 mL.  DRAINS:  A 19-French Blake drain placed in the gallbladder fossa.  SPECIMENS:  Gallbladder and contents sent to pathology for permanent section.  COMPLICATIONS:  None apparent.  FINDINGS:  Acute cholecystitis with a thick fibrinous rind in normal cholangiogram, very large gallstones, hydropic-appearing fluid in the gallbladder.  INDICATION FOR PROCEDURE:  Mr. Wuertz is a 41 year old male with a 3- day history of right upper quadrant and back pain after eating chili. He has a white count of 24,000, and ultrasound with a thickened gallbladder, gallstones and right upper quadrant pain consistent with acute cholecystitis.  OPERATIVE DETAILS:  Mr. Titzer was seen and evaluated in the preoperative area, and risks and benefits of procedure were again discussed in lay terms.  Informed consent was obtained.  He was already on therapeutic antibiotics and he was taken to the operating room, placed on the table in supine position and general endotracheal anesthesia was obtained.  Foley catheter was placed and his abdomen was prepped and draped in a standard surgical fashion.  Procedure time-out was performed with all operative  team members to confirm proper patient and procedure, and a supraumbilical midline incision was made in the skin and dissection carried down to the subcutaneous tissue using blunt dissection.  The abdominal wall fascia was elevated and sharply incised and peritoneum entered bluntly.  A 12-mm Hasson port was placed through this incision and the pneumoperitoneum was obtained.  There was no evidence of bowel injury upon entry, and immediately upon entry, we are able to visualize some purulent murky fluid in the right upper quadrant with fibrinous exudate overlying the gallbladder and some adhesions from the omentum and the gallbladder.  Adhesiolysis was performed with sharp dissection and blunt dissection in the right lateral abdomen through an 11-mm epigastric trocar and two 5-mm right upper quadrant trocars were placed under direct visualization.  Then, the gallbladder that was very erythematous and injected and some fibrinous exudate, the omentum portion had pealed off with only blunt dissection, and we were able to grasp the fundus of the gallbladder, retracted cephalad.  We did tear the gallbladder with this maneuver and some hydropic-appearing fluid came out of the gallbladder, but no stones were spilled.  The peritoneal attachments were taken down using blunt dissection, and the cystic duct was identified and skeletonized using blunt dissection.  The lymph node of Calot was identified and dissection carried out above the lymph  node to identify the cystic artery.  The artery was skeletonized and then clipped between the hemoclips, but it was not divided at this time. Critical view of safety was obtained visualizing a single cystic duct entering the gallbladder with visualizing the liver parenchyma through the triangle of Calot.  A clip was placed on the gallbladder side of the duct, and a small ductotomy was made and the cholangiogram catheter was placed through a separate stab incision  and placed into the ductotomy. Cholangiogram was performed, which demonstrated a patent cystic duct and free flow of bile into the duodenum and visualization of the right and left hepatic ducts, that appeared to be an accessory right hepatic duct as well.  The catheter was removed and the duct was clipped with 3 hemoclips on the stay side, and the duct was transected.  The previously clipped the artery was transected as well.  Then, using mostly blunt dissection for the dissection from the gallbladder fossa, we elevated the gallbladder from the area in the critical structures.  The gallbladder had a thick rind and actually filled up fairly easily, and much of the rind was left in place.  As we were about midway up on the gallbladder, then so called by using Bovie electrocautery for the division and removal of the gallbladder from the gallbladder fossa.  The gallbladder was placed in an EndoCatch bag and removed from the umbilical trocar site, but I did have to extend the fascial incision in order to remove the gallstones.  He had a very large palpable gallstones within the gallbladder.  These were sent to pathology for permanent sectioning.  The trocar was placed back in the abdomen, and right upper quadrant was irrigated with sterile saline solution until  irrigation returned clear.  The right upper quadrant was inspected for hemostasis, which was appeared to be adequate.  The right upper quadrant area of the adhesiolysis appeared to be adequate without any evidence of bowel injury, and a 19-French Blake drain was placed through the epigastric port and exited through the right upper quadrant trocar site, and placed in the gallbladder fossa.  Sutured in place with a nylon drain stitch. The other 5-mm trocars removed under direct visualization.  Abdominal wall was noted be hemostatic.  The umbilical fascia was approximated with interrupted 0 Vicryl sutures, and the fascial closure appeared  to be adequate.  The abdomen was re-insufflated with carbon dioxide gas and the abdominal wall closure was noted be adequate without any evidence of bowel injury.  The gallbladder fossa appeared to be hemostatic and the trocar was removed.  The skin edges were injected with a total of 26 mL of 1% lidocaine with epinephrine and 0.25% Marcaine in a 50:50 mixture. Skin edges were approximated with a 4-0 Monocryl subcuticular suture. Skin was washed and dried, and Dermabond was applied.  All sponge, needle, instrument counts were correct at the end of the case.  The patient tolerated the procedure well without apparent complications.          ______________________________ Lodema Pilot, MD     BL/MEDQ  D:  12/28/2011  T:  12/28/2011  Job:  [id number]

## 2011-12-28 NOTE — ED Notes (Signed)
Dr Andrey Campanile per verbal rescheduled beginning time of [place]. Bag hung at 02:18 am

## 2011-12-29 ENCOUNTER — Encounter (HOSPITAL_COMMUNITY): Payer: Self-pay | Admitting: General Surgery

## 2011-12-29 NOTE — Progress Notes (Signed)
1 Day Post-Op  Subjective: Pt ok. Sore as expected. No n/v Has been up a little last pm Drank some clears.  Objective: Vital signs in last 24 hours: Temp:  [98.1 F (36.7 C)-100.5 F (38.1 C)] 98.3 F (36.8 C) (02/19 0509) Pulse Rate:  [85-119] 87  (02/19 0509) Resp:  [12-32] 20  (02/19 0509) BP: (115-138)/(64-85) 116/70 mmHg (02/19 0509) SpO2:  [88 %-96 %] 94 % (02/19 0509) FiO2 (%):  [2 %] 2 % (02/18 0939)    Intake/Output this shift:    Physical Exam: BP 116/70  Pulse 87  Temp(Src) 98.3 F (36.8 C) (Oral)  Resp 20  Ht 6' (1.829 m)  Wt 113.399 kg (250 lb)  BMI 33.91 kg/m2  SpO2 94% Lungs: CTA without w/r/r Heart: Regular Abdomen: soft, ND, appropriately tender   Incisions all c/d/i without erythema or hematoma.   Drain intact, no bile Ext: No edema or tenderness   Labs: CBC  Basename 12/28/11 1030 12/27/11 2106  WBC 23.9* 23.7*  HGB 17.0 17.9*  HCT 50.0 48.5  PLT 156 164   BMET  Basename 12/28/11 1030 12/27/11 2106  NA 139 135  K 3.7 3.4*  CL 102 99  CO2 21 23  GLUCOSE 98 129*  BUN 18 13  CREATININE 1.04 1.07  CALCIUM 9.3 9.8   LFT  Basename 12/28/11 1030 12/27/11 2106  PROT 7.3 --  ALBUMIN 3.4* --  AST 22 --  ALT 35 --  ALKPHOS 93 --  BILITOT 2.3* --  BILIDIR -- --  IBILI -- --  LIPASE -- 14   PT/INR No results found for this basename: LABPROT:2,INR:2 in the last 72 hours ABG No results found for this basename: PHART:2,PCO2:2,PO2:2,HCO3:2 in the last 72 hours  Studies/Results: Dg Chest 2 View  12/27/2011  *RADIOLOGY REPORT*  Clinical Data: Right lower chest pain, especially on inspiration.  CHEST - 2 VIEW  Comparison: None.  Findings: The lungs are hypoexpanded; mild bibasilar opacities may reflect atelectasis or possibly pneumonia.  There is no evidence of pleural effusion or pneumothorax.  The heart is normal in size; the mediastinal contour is within normal limits.  No acute osseous abnormalities are seen.  IMPRESSION: Lungs  significantly hypoexpanded; mild basilar opacities may reflect atelectasis or possibly pneumonia.  Original Report Authenticated By: Tonia Ghent, M.D.   Dg Cholangiogram Operative  12/28/2011  *RADIOLOGY REPORT*  Clinical data:  Cholelithiasis.  Cholecystitis.  Laparoscopic cholecystectomy.  INTRAOPERATIVE CHOLANGIOGRAM 12/28/2011:  Comparison: Abdominal ultrasound yesterday.  Findings:  A series of cholangiographic images from the C-arm fluoroscopic device were submitted for interpretation post- operatively.  The cannula is present in the cystic duct remnant with excellent opacification of the common bile duct, common hepatic duct, and proximal intrahepatic ducts.  No filling defects to suggest retained bile duct stones.  Excellent antegrade flow into the duodenum.  No extravasation.  Note is made of an anatomic variant in that the bile duct for what I believe is the posterior segment right hepatic lobe has a separate insertion onto the common duct.  The radiologic technologist documented 15 seconds of fluoroscopy time.  Please correlate with findings at real time fluoroscopy.  IMPRESSION: Normal intraoperative cholangiogram.  These images were submitted for radiologic interpretation only. Please see the procedural report for the amount of contrast and the fluoroscopy time utilized.  Original Report Authenticated By: Arnell Sieving, M.D.   US Abdomen Complete  12/28/2011  *RADIOLOGY REPORT*  Clinical Data:  Chest, abdomen and back pain.  COMPLETE  ABDOMINAL ULTRASOUND  Comparison:  None.  Findings:  Gallbladder:  2.6 cm gallstone lodged in the gallbladder neck. Mild to moderate diffuse gallbladder wall thickening with a maximum thickness of 5.5 mm.  The patient was not tender over the gallbladder.  Common bile duct:  Poorly visualized, measuring 5.5 mm in maximum diameter proximally.  Liver:  Mildly hyperechoic and mildly heterogeneous.  IVC:  Poorly visualized.  Grossly normal proximally.  Pancreas:   Not visualized.  Spleen:  Normal, measuring 8.2 cm in length.  Right Kidney:  Normal, measuring 11.9 cm in length.  Left Kidney:  Normal, measuring 12.8 cm in length.  Abdominal aorta:  Obscured by overlying bowel gas.  IMPRESSION:  1.  Cholelithiasis. 2.  Mild diffuse gallbladder wall thickening.  In the absence of tenderness over the gallbladder, this is most likely due to chronic cholecystitis. 3.  Non-visualized pancreas and abdominal aorta.  Original Report Authenticated By: Darrol Angel, M.D.    Assessment: Principal Problem:  *Gallbladder calculus with acute cholecystitis   Procedure(s): LAPAROSCOPIC CHOLECYSTECTOMY WITH INTRAOPERATIVE CHOLANGIOGRAM  Plan: Continue Cipro Advance diet and watch drain output INcrease OOB/IS use. Anticipate ready for dc tomorrow.  LOS: 2 days    Alyse Low 12/29/2011 8:14 AM  Advance diet as tolerated and convert to PO abx.  If he still looks good tomorrow, will plan for discharge to home.

## 2011-12-29 NOTE — Plan of Care (Signed)
Problem: Diagnosis - Type of Surgery Goal: General Surgical Patient Education (See Patient Education module for education specifics)  Outcome: Completed/Met Date Met:  12/29/11 Lap Chole with IOC

## 2011-12-30 LAB — CBC
HCT: 41.4 % (ref 39.0–52.0)
Hemoglobin: 14.6 g/dL (ref 13.0–17.0)
MCH: 29.5 pg (ref 26.0–34.0)
MCHC: 35.3 g/dL (ref 30.0–36.0)

## 2011-12-30 MED ORDER — CIPROFLOXACIN HCL 500 MG PO TABS: 500.0000 mg | ORAL_TABLET | Freq: Two times a day (BID) | ORAL | Status: AC

## 2011-12-30 MED ORDER — HYDROCODONE-ACETAMINOPHEN 5-325 MG PO TABS: 1.0000 | ORAL_TABLET | ORAL | Status: AC | PRN

## 2011-12-30 NOTE — Progress Notes (Signed)
Discharge Home. Home discharge instruction given , no question verbalized, JP intact, JP teaching given. Alert and oriented, ambulatory, not in any distress.

## 2011-12-30 NOTE — Discharge Summary (Signed)
Physician Discharge Summary  Patient ID: Joshua Long MRN: [id number] DOB/AGE: 06/12/1971 40 y.o.  Admit date: 12/27/2011 Discharge date: 12/30/2011  Admission Diagnoses: Cholecystitis Discharge Diagnoses:  Principal Problem:  *Gallbladder calculus with acute cholecystitis    Procedures: Procedure(s): LAPAROSCOPIC CHOLECYSTECTOMY WITH INTRAOPERATIVE CHOLANGIOGRAM  Discharged Condition: good  Hospital Course: HPI: 41 year old Caucasian man presents to the emergency room complaining of abdominal pain since Saturday morning. He states that the pain initially started in his epigastric area but later moved to his right upper quadrant. The pain in his upper abdomen gradually eased off over several hours; however, the pain in his right upper side has been constant ever since yesterday afternoon. He describes it as a sharp stabbing pain. He denies any previous symptoms. He states that on Friday he cooked an extra hot batch of chili and he developed some mild upper bowel pain after eating a bowl. However it resolved. He states that he hasn't had much of an appetite today. He describes the pain as 8/10. He denies any fevers or chills. He did have some nausea yesterday morning but no vomiting. He denies any diarrhea, constipation, melena, hematochezia, or acholic stools. He denies any difficulty swallowing. He tried taking some Tylenol and drinking a glass of red wine. Neither of which reduced his abdominal pain.  After evaluation by Dr. Biagio Long he was admitted for operative management. He was taken to the OR on 2/18 and underwent lap chole. He had a very infected gallbladder and a drain was left. He was continued on IV antibiotics for an additional 24 hrs. He felt better by POD#2 and was tolerating reg diet. He has been voiding well and his drain is stable with no bile. We feel he is stable for discharge. He will go home on abx and with his drain.  Consults: None   Discharge Exam: Blood pressure  113/72, pulse 88, temperature 98.4 F (36.9 C), temperature source Oral, resp. rate 16, height 6' (1.829 m), weight 250 lb (113.399 kg), SpO2 95.00%. Lungs: CTA without w/r/r Heart: Regular Abdomen: soft, ND, appropriately tender   Incisions all c/d/i without erythema or hematoma. Ext: No edema or tenderness  Disposition: To Home  Discharge Orders    Future Appointments: Provider: Department: Dept Phone: Center:   01/06/2012 8:45 AM Joshua Abide, DO [facility] [phone] None     Future Orders Please Complete By Expires   Diet - Long sodium heart healthy      Increase activity slowly      May shower / Bathe      May walk up steps      Change dressing (specify)      Comments:   Dressing change: Keep small dry dressing around drain site. Change daily   Call MD for:  redness, tenderness, or signs of infection (pain, swelling, redness, odor or green/yellow discharge around incision site)      Call MD for:  severe uncontrolled pain      Call MD for:  persistant nausea and vomiting      Call MD for:  temperature >100.4        Medication List  As of 12/30/2011 11:14 AM   TAKE these medications         ciprofloxacin 500 MG tablet   Commonly known as: CIPRO   Take 1 tablet (500 mg total) by mouth 2 (two) times daily.      HYDROcodone-acetaminophen 5-325 MG per tablet   Commonly known as: NORCO   Take 1-2  tablets by mouth every 4 (four) hours as needed for pain.           Follow-up Information    Follow up with LAYTON, BRIAN DAVID, DO on 01/06/2012. (8:45pm)    Contact information:   [address] [place] [address] [phone]          Signed: Alyse Long 12/30/2011, 11:14 AM

## 2011-12-30 NOTE — Progress Notes (Signed)
2 Days Post-Op  Subjective: Pt ok. Feeling better than yesterday. +flatus. Hasn't tried solid diet yet. No N/V  Objective: Vital signs in last 24 hours: Temp:  [97.7 F (36.5 C)-98.7 F (37.1 C)] 98.4 F (36.9 C) (02/20 0610) Pulse Rate:  [82-104] 88  (02/20 0610) Resp:  [16-19] 16  (02/20 0610) BP: (113-134)/(61-83) 113/72 mmHg (02/20 0610) SpO2:  [90 %-95 %] 95 % (02/20 0610) Last BM Date: 12/27/11  Intake/Output this shift:    Physical Exam: BP 113/72  Pulse 88  Temp(Src) 98.4 F (36.9 C) (Oral)  Resp 16  Ht 6' (1.829 m)  Wt 113.399 kg (250 lb)  BMI 33.91 kg/m2  SpO2 95% Lungs: CTA without w/r/r Heart: Regular Abdomen: soft, ND, appropriately tender   Incisions all c/d/i without erythema or hematoma.   Drain intact, serous output, no bile. Ext: No edema or tenderness   Labs: CBC  Basename 12/30/11 0615 12/28/11 1030  WBC 9.9 23.9*  HGB 14.6 17.0  HCT 41.4 50.0  PLT 163 156   BMET  Basename 12/28/11 1030 12/27/11 2106  NA 139 135  K 3.7 3.4*  CL 102 99  CO2 21 23  GLUCOSE 98 129*  BUN 18 13  CREATININE 1.04 1.07  CALCIUM 9.3 9.8   LFT  Basename 12/28/11 1030 12/27/11 2106  PROT 7.3 --  ALBUMIN 3.4* --  AST 22 --  ALT 35 --  ALKPHOS 93 --  BILITOT 2.3* --  BILIDIR -- --  IBILI -- --  LIPASE -- 14   PT/INR No results found for this basename: LABPROT:2,INR:2 in the last 72 hours ABG No results found for this basename: PHART:2,PCO2:2,PO2:2,HCO3:2 in the last 72 hours  Studies/Results: Dg Cholangiogram Operative  12/28/2011  *RADIOLOGY REPORT*  Clinical data:  Cholelithiasis.  Cholecystitis.  Laparoscopic cholecystectomy.  INTRAOPERATIVE CHOLANGIOGRAM 12/28/2011:  Comparison: Abdominal ultrasound yesterday.  Findings:  A series of cholangiographic images from the C-arm fluoroscopic device were submitted for interpretation post- operatively.  The cannula is present in the cystic duct remnant with excellent opacification of the common bile  duct, common hepatic duct, and proximal intrahepatic ducts.  No filling defects to suggest retained bile duct stones.  Excellent antegrade flow into the duodenum.  No extravasation.  Note is made of an anatomic variant in that the bile duct for what I believe is the posterior segment right hepatic lobe has a separate insertion onto the common duct.  The radiologic technologist documented 15 seconds of fluoroscopy time.  Please correlate with findings at real time fluoroscopy.  IMPRESSION: Normal intraoperative cholangiogram.  These images were submitted for radiologic interpretation only. Please see the procedural report for the amount of contrast and the fluoroscopy time utilized.  Original Report Authenticated By: Arnell Sieving, M.D.    Assessment: Principal Problem:  *Gallbladder calculus with acute cholecystitis   Procedure(s): LAPAROSCOPIC CHOLECYSTECTOMY WITH INTRAOPERATIVE CHOLANGIOGRAM  Plan: WBC normal. Reg diet today. Increase activity. Probable DC home later today.  LOS: 3 days    Alyse Low 12/30/2011 8:24 AM  Looks good.  Tolerating diet and wbc now normal.  Likely home today with the drain and will f/u in clinic next week for drain removal

## 2011-12-30 NOTE — Discharge Instructions (Signed)
[facility] ______[facility] °LAPAROSCOPIC SURGERY: POST OP INSTRUCTIONS °Always review your discharge instruction sheet given to you by the facility where your surgery was performed. °IF YOU HAVE DISABILITY OR FAMILY LEAVE FORMS, YOU MUST BRING THEM TO THE OFFICE FOR PROCESSING.   °DO NOT GIVE THEM TO YOUR DOCTOR. ° °1. A prescription for pain medication may be given to you upon discharge.  Take your pain medication as prescribed, if needed.  If narcotic pain medicine is not needed, then you may take acetaminophen (Tylenol) or ibuprofen (Advil) as needed. °2. Take your usually prescribed medications unless otherwise directed. °3. If you need a refill on your pain medication, please contact your pharmacy.  They will contact our office to request authorization. Prescriptions will not be filled after 5pm or on week-ends. °4. You should follow a light diet the first few days after arrival home, such as soup and crackers, etc.  Be sure to include lots of fluids daily. °5. Most patients will experience some swelling and bruising in the area of the incisions.  Ice packs will help.  Swelling and bruising can take several days to resolve.  °6. It is common to experience some constipation if taking pain medication after surgery.  Increasing fluid intake and taking a stool softener (such as Colace) will usually help or prevent this problem from occurring.  A mild laxative (Milk of Magnesia or Miralax) should be taken according to package instructions if there are no bowel movements after 48 hours. °7. Unless discharge instructions indicate otherwise, you may remove your bandages 24-48 hours after surgery, and you may shower at that time.  You may have steri-strips (small skin tapes) in place directly over the incision.  These strips should be left on the skin for 7-10 days.  If your surgeon used skin glue on the incision, you may shower in 24 hours.  The glue will flake off over the next 2-3 weeks.  Any sutures or  staples will be removed at the office during your follow-up visit. °8. ACTIVITIES:  You may resume regular (light) daily activities beginning the next day--such as daily self-care, walking, climbing stairs--gradually increasing activities as tolerated.  You may have sexual intercourse when it is comfortable.  Refrain from any heavy lifting or straining until approved by your doctor. °a. You may drive when you are no longer taking prescription pain medication, you can comfortably wear a seatbelt, and you can safely maneuver your car and apply brakes. °b. RETURN TO WORK:  __________________________________________________________ °9. You should see your doctor in the office for a follow-up appointment approximately 2-3 weeks after your surgery.  Make sure that you call for this appointment within a day or two after you arrive home to insure a convenient appointment time. °10. OTHER INSTRUCTIONS: __________________________________________________________________________________________________________________________ __________________________________________________________________________________________________________________________ °WHEN TO CALL YOUR DOCTOR: °1. Fever over 101.0 °2. Inability to urinate °3. Continued bleeding from incision. °4. Increased pain, redness, or drainage from the incision. °5. Increasing abdominal pain ° °The clinic staff is available to answer your questions during regular business hours.  Please don’t hesitate to call and ask to speak to one of the nurses for clinical concerns.  If you have a medical emergency, go to the nearest emergency room or call 911.  A surgeon from [facility] is always on call at the hospital. °[address], [address], [place], [place]  [address] ? [address] [address], [place], [place]   [address] °[phone] ? [phone] ? FAX [phone] °Web site:   [email or web address] °

## 2012-01-06 ENCOUNTER — Encounter (INDEPENDENT_AMBULATORY_CARE_PROVIDER_SITE_OTHER): Payer: Self-pay | Admitting: General Surgery

## 2012-01-06 ENCOUNTER — Ambulatory Visit (INDEPENDENT_AMBULATORY_CARE_PROVIDER_SITE_OTHER): Payer: Self-pay | Admitting: General Surgery

## 2012-01-06 VITALS — BP 136/88 | HR 107 | Temp 96.0°F | Ht 72.0 in | Wt 272.8 lb

## 2012-01-06 DIAGNOSIS — Z4889 Encounter for other specified surgical aftercare: Secondary | ICD-10-CM

## 2012-01-06 DIAGNOSIS — Z5189 Encounter for other specified aftercare: Secondary | ICD-10-CM

## 2012-01-06 NOTE — Progress Notes (Signed)
Subjective:     Patient ID: Joshua Long, male   DOB: 06-03-1971, 40 y.o.   MRN: [id number]  HPI The patient follows up status post laparoscopic cholecystectomy for acute suppurative cholecystitis last week. I placed a drain and is here for drain removal. He states that he is tolerating regular diet and has not taken one pain pill since his discharge. He continues on antibiotics for another 2 days but he denies any fevers or chills and states that the drainage has continued to clear and denies any dark JP output.  Review of Systems     Objective:   Physical Exam He is in no distress and nontoxic-appearing  His abdomen is soft and nontender on exam his incisions are healing well without infection. He has a right lateral JP drain through one of the trocar sites which has certainly less output without any evidence of renal or black or bilious output.    Assessment:     Status post laparoscopic cholecystectomy-doing well He has no evidence of bile in his drain in I removed the drain today in clinic. This was well tolerated and he has no evidence of any postoperative complications.    Plan:     I recommend that he finishes last 2 days of antibiotics and gradually increase his activity as tolerated and he can follow up with me on a p.r.n. basis.

## 2018-05-16 ENCOUNTER — Other Ambulatory Visit: Payer: Self-pay

## 2018-05-16 ENCOUNTER — Encounter: Payer: Self-pay | Admitting: Emergency Medicine

## 2018-05-16 ENCOUNTER — Emergency Department
Admission: EM | Admit: 2018-05-16 | Discharge: 2018-05-16 | Disposition: A | Discharge status: Home / Self Care | Payer: Self-pay | Attending: Emergency Medicine | Admitting: Emergency Medicine

## 2018-05-16 ENCOUNTER — Emergency Department: Payer: Self-pay

## 2018-05-16 DIAGNOSIS — Z9049 Acquired absence of other specified parts of digestive tract: Secondary | ICD-10-CM | POA: Insufficient documentation

## 2018-05-16 DIAGNOSIS — I10 Essential (primary) hypertension: Secondary | ICD-10-CM | POA: Insufficient documentation

## 2018-05-16 DIAGNOSIS — R42 Dizziness and giddiness: Secondary | ICD-10-CM

## 2018-05-16 HISTORY — DX: Essential (primary) hypertension: I10

## 2018-05-16 LAB — CBC WITH DIFFERENTIAL/PLATELET
Basophils Absolute: 0.1 10*3/uL (ref 0–0.1)
Basophils Relative: 1 %
EOS ABS: 0.3 10*3/uL (ref 0–0.7)
Eosinophils Relative: 3 %
HCT: 51.5 % (ref 40.0–52.0)
HEMOGLOBIN: 18.4 g/dL — AB (ref 13.0–18.0)
LYMPHS ABS: 2.2 10*3/uL (ref 1.0–3.6)
LYMPHS PCT: 23 %
MCH: 30.1 pg (ref 26.0–34.0)
MCHC: 35.7 g/dL (ref 32.0–36.0)
MCV: 84.2 fL (ref 80.0–100.0)
MONOS PCT: 5 %
Monocytes Absolute: 0.5 10*3/uL (ref 0.2–1.0)
NEUTROS ABS: 6.8 10*3/uL — AB (ref 1.4–6.5)
Neutrophils Relative %: 68 %
Platelets: 193 10*3/uL (ref 150–440)
RBC: 6.12 MIL/uL — ABNORMAL HIGH (ref 4.40–5.90)
RDW: 15 % — ABNORMAL HIGH (ref 11.5–14.5)
WBC: 9.8 10*3/uL (ref 3.8–10.6)

## 2018-05-16 LAB — BASIC METABOLIC PANEL
Anion gap: 9 (ref 5–15)
BUN: 13 mg/dL (ref 6–20)
CHLORIDE: 104 mmol/L (ref 98–111)
CO2: 26 mmol/L (ref 22–32)
Calcium: 9.3 mg/dL (ref 8.9–10.3)
Creatinine, Ser: 0.72 mg/dL (ref 0.61–1.24)
GFR calc Af Amer: >60 mL/min (ref >60)
GFR calc non Af Amer: >60 mL/min (ref >60)
GLUCOSE: 130 mg/dL — AB (ref 70–99)
POTASSIUM: 3 mmol/L — AB (ref 3.5–5.1)
Sodium: 139 mmol/L (ref 135–145)

## 2018-05-16 MED ORDER — PROMETHAZINE HCL 25 MG PO TABS
25.0000 mg | ORAL_TABLET | Freq: Once | ORAL | Status: AC
  Administered 2018-05-16: 25 mg via ORAL
  Filled 2018-05-16: qty 1

## 2018-05-16 MED ORDER — ONDANSETRON 4 MG PO TBDP: 4.0000 mg | ORAL_TABLET | Freq: Three times a day (TID) | ORAL | 0 refills | Status: AC | PRN

## 2018-05-16 MED ORDER — DIAZEPAM 2 MG PO TABS: 2.0000 mg | ORAL_TABLET | Freq: Two times a day (BID) | ORAL | 0 refills | Status: AC | PRN

## 2018-05-16 MED ORDER — DIAZEPAM 2 MG PO TABS
2.0000 mg | ORAL_TABLET | Freq: Once | ORAL | Status: AC
Start: 2018-05-16 — End: 2018-05-16
  Administered 2018-05-16: 2 mg via ORAL
  Filled 2018-05-16: qty 1

## 2018-05-16 MED ORDER — SODIUM CHLORIDE 0.9 % IV BOLUS
1000.0000 mL | Freq: Once | INTRAVENOUS | Status: AC
  Administered 2018-05-16: 1000 mL via INTRAVENOUS

## 2018-05-16 MED ORDER — MECLIZINE HCL 25 MG PO TABS: 25.0000 mg | ORAL_TABLET | Freq: Three times a day (TID) | ORAL | 0 refills | Status: AC | PRN

## 2018-05-16 MED ORDER — MECLIZINE HCL 25 MG PO TABS
25.0000 mg | ORAL_TABLET | Freq: Once | ORAL | Status: AC
  Administered 2018-05-16: 25 mg via ORAL
  Filled 2018-05-16: qty 1

## 2018-05-16 NOTE — ED Notes (Signed)
Patient transported to MRI 

## 2018-05-16 NOTE — Discharge Instructions (Addendum)
The MRI was negative.  This means you likely have an inner ear problem.  I will have you take Zofran 1 melt on your tongue wafers 3 times a day, Antivert 1 pill 3 times a day and Valium 1 twice a day for the next few days to see if this helps.  I will have you follow-up with ENT as well.  Please call them later on today to get an appointment for follow-up later this week or next week.  Please return here if you are worse in any way.

## 2018-05-16 NOTE — ED Notes (Signed)
ED Provider at bedside. 

## 2018-05-16 NOTE — ED Notes (Signed)
Pt alert and oriented X4, active, cooperative, pt in NAD. RR even and unlabored, color WNL.  Pt informed to return if any life threatening symptoms occur.  Discharge and followup instructions reviewed.  

## 2018-05-16 NOTE — ED Provider Notes (Signed)
[facility] Emergency Department Provider Note   ____________________________________________   First MD Initiated Contact with Patient 05/16/18 [phone]     (approximate)  I have reviewed the triage vital signs and the nursing notes.   HISTORY  Chief Complaint Headache and Dizziness    HPI Joshua Long is a 47 y.o. male patient reports an episode of vertigo starting about a week ago lasting 6 hours he had sweatiness with it it went away 3 or 4 days ago he had another episode lasted about an hour and then last evening he had another episode of vertigo room spinning which is continuing now.  He reports some mild headache and some photophobia.  He is nauseated and vomited with the first episode in the second this time he just feels like he wants to vomit but has not.  Headache is not severe.  Has a history of blood pressure in the 140s.  Initial blood pressure was quite high with gone down to 139/101 now.  Vertigo is not really different if he moves his head.  He   Past Medical History:  Diagnosis Date  . Hypertension     Patient Active Problem List   Diagnosis Date Noted  . Gallbladder calculus with acute cholecystitis 12/28/2011    Past Surgical History:  Procedure Laterality Date  . CHOLECYSTECTOMY  12/28/2011   Procedure: LAPAROSCOPIC CHOLECYSTECTOMY WITH INTRAOPERATIVE CHOLANGIOGRAM;  Surgeon: Rulon Abide, DO;  Location: [facility] OR;  Service: General;  Laterality: N/A;  . TONSILLECTOMY AND ADENOIDECTOMY      Prior to Admission medications   Medication Sig Start Date End Date Taking? Authorizing Provider  diazepam (VALIUM) 2 MG tablet Take 1 tablet (2 mg total) by mouth every 12 (twelve) hours as needed (vertigo). 05/16/18 05/16/19  Arnaldo Natal, MD  meclizine (ANTIVERT) 25 MG tablet Take 1 tablet (25 mg total) by mouth 3 (three) times daily as needed for dizziness. 05/16/18   Arnaldo Natal, MD  ondansetron (ZOFRAN ODT) 4 MG disintegrating tablet  Take 1 tablet (4 mg total) by mouth every 8 (eight) hours as needed for nausea or vomiting. 05/16/18   Arnaldo Natal, MD    Allergies Patient has no known allergies.  History reviewed. No pertinent family history.  Social History Social History   Tobacco Use  . Smoking status: Never Smoker  . Smokeless tobacco: Never Used  Substance Use Topics  . Alcohol use: Yes    Alcohol/week: 0.6 oz    Types: 1 Standard drinks or equivalent per week  . Drug use: No    Review of Systems  Constitutional: No fever/chills Eyes: No visual changes. ENT: No sore throat. Cardiovascular: Denies chest pain. Respiratory: Denies shortness of breath. Gastrointestinal: No abdominal pain.   nausea,  vomiting.  No diarrhea.  No constipation. Genitourinary: Negative for dysuria. Musculoskeletal: Negative for back pain. Skin: Negative for rash. Neurological: Negative for headaches, focal weakness  _________________   PHYSICAL EXAM:  VITAL SIGNS: ED Triage Vitals [05/16/18 0906]  Enc Vitals Group     BP (!) 180/111     Pulse Rate 93     Resp 16     Temp 98.4 F (36.9 C)     Temp Source Oral     SpO2 96 %     Weight 272 lb (123.4 kg)     Height      Head Circumference      Peak Flow      Pain Score 4  Pain Loc      Pain Edu?      Excl. in GC?     Constitutional: Alert and oriented. Well appearing and in no acute distress. Eyes: Conjunctivae are normal. PERRL. EOMI. fundi appear normal although it is difficult to see because of the nystagmus.  Patient does have horizontal nystagmus. Head: Atraumatic. Nose: No congestion/rhinnorhea. Mouth/Throat: Mucous membranes are moist.  Oropharynx non-erythematous. Neck: No stridor. Cardiovascular: Normal rate, regular rhythm. Grossly normal heart sounds.  Good peripheral circulation. Respiratory: Normal respiratory effort.  No retractions. Lungs CTAB. Gastrointestinal: Soft and nontender. No distention. No abdominal bruits. No CVA  tenderness. Musculoskeletal: No lower extremity tenderness nor edema.  No joint effusions. Neurologic:  Normal speech and language. No gross focal neurologic deficits are appreciated.  Specifically cranial nerves II through XII are intact except for the nystagmus.  Visual fields were not checked.  Cerebellar finger-nose rapid alternating movements and hands are normal motor strength is 5/5 throughout sensation is intact throughout. Skin:  Skin is warm, dry and intact. No rash noted. Psychiatric: Mood and affect are normal. Speech and behavior are normal.  ____________________________________________   LABS (all labs ordered are listed, but only abnormal results are displayed)  Labs Reviewed  BASIC METABOLIC PANEL - Abnormal; Notable for the following components:      Result Value   Potassium 3.0 (*)    Glucose, Bld 130 (*)    All other components within normal limits  CBC WITH DIFFERENTIAL/PLATELET - Abnormal; Notable for the following components:   RBC 6.12 (*)    Hemoglobin 18.4 (*)    RDW 15.0 (*)    Neutro Abs 6.8 (*)    All other components within normal limits   ____________________________________________  EKG  EKG read and interpreted by me shows sinus rhythm rate of 90 normal axis nonspecific ST-T wave changes very similar to previous EKG. ____________________________________________  RADIOLOGY  ED MD interpretation: MRI read by radiology as no acute disease  Official radiology report(s): Mr Brain Wo Contrast  Result Date: 05/16/2018 CLINICAL DATA:  Dizziness and headache over the last 2 weeks. Photosensitivity. EXAM: MRI HEAD WITHOUT CONTRAST TECHNIQUE: Multiplanar, multiecho pulse sequences of the brain and surrounding structures were obtained without intravenous contrast. COMPARISON:  None. FINDINGS: Brain: Brain has normal appearance without evidence of malformation, atrophy, old or acute small or large vessel infarction, mass lesion, hemorrhage, hydrocephalus or  extra-axial collection. Vascular: Major vessels at the base of the brain show flow. Venous sinuses appear patent. Skull and upper cervical spine: Normal. Sinuses/Orbits: Minimal mucosal inflammatory changes of the frontal and ethmoid sinuses. Orbits negative. Other: None significant. IMPRESSION: Normal appearance of the brain itself. There are mild mucosal inflammatory changes of the ethmoid and frontal sinuses. Electronically Signed   By: Paulina Fusi M.D.   On: 05/16/2018 11:34    ____________________________________________   PROCEDURES  Procedure(s) performed:   Procedures  Critical Care performed:   ____________________________________________   INITIAL IMPRESSION / ASSESSMENT AND PLAN / ED COURSE  Patient reports that dizziness the spinning is much better but when he stands he is woozy and lightheaded.  We will give him a liter fluid see if this helps.  It might also be from his medication he is received.  Either way after the fluid I anticipate discharging him.  After the fluid he feels much better.  He still little lightheaded.  This probably is due to the medicines I gave him.  Otherwise he is feeling much better.  I will  let him go as planned.        ____________________________________________   FINAL CLINICAL IMPRESSION(S) / ED DIAGNOSES  Final diagnoses:  Vertigo     ED Discharge Orders        Ordered    ondansetron (ZOFRAN ODT) 4 MG disintegrating tablet  Every 8 hours PRN     05/16/18 1143    diazepam (VALIUM) 2 MG tablet  Every 12 hours PRN     05/16/18 1143    meclizine (ANTIVERT) 25 MG tablet  3 times daily PRN     05/16/18 1143       Note:  This document was prepared using Dragon voice recognition software and may include unintentional dictation errors.    Arnaldo NatalMalinda, Paul F, MD 05/16/18 1515

## 2018-05-16 NOTE — ED Triage Notes (Addendum)
Pt to ed with c/o dizziness, headache x 2 weeks. +photosensitivity.  Pt states he does not have a PMD.  Reports hx of mild HTN.

## 2018-06-07 ENCOUNTER — Ambulatory Visit: Payer: Self-pay | Admitting: Urology

## 2018-06-07 VITALS — BP 178/121 | HR 97 | Temp 98.3°F | Ht 71.65 in | Wt 279.6 lb

## 2018-06-07 DIAGNOSIS — N529 Male erectile dysfunction, unspecified: Secondary | ICD-10-CM

## 2018-06-07 DIAGNOSIS — I10 Essential (primary) hypertension: Secondary | ICD-10-CM

## 2018-06-07 MED ORDER — SILDENAFIL CITRATE 100 MG PO TABS: 100.0000 mg | ORAL_TABLET | Freq: Every day | ORAL | 0 refills | Status: AC | PRN

## 2018-06-07 MED ORDER — POTASSIUM CHLORIDE ER 10 MEQ PO TBCR: 10.0000 meq | EXTENDED_RELEASE_TABLET | Freq: Every day | ORAL | 0 refills | Status: AC

## 2018-06-07 MED ORDER — HYDROCHLOROTHIAZIDE 12.5 MG PO CAPS: 12.5000 mg | ORAL_CAPSULE | Freq: Every day | ORAL | 0 refills | Status: DC

## 2018-06-07 NOTE — Progress Notes (Signed)
  Patient: Joshua Long Male    DOB: 1971/10/04   47 y.o.   MRN: [id number] Visit Date: 06/07/2018  Today's Provider: [facility]   Chief Complaint  Patient presents with  . Establish Care    Experiencing dizziness, sweating, vomitting has happened multiple times in the last month; recent ED visit for episodes; pt thinks it is vestibular neuritis  . Follow-up   Subjective:    HPI Seen in ED on 05/16/2018 for vertigo.  MRI of brain noted normal appearance of the brain itself. There are mild mucosal inflammatory changes of the ethmoid and frontal sinuses.  Now has constant ringing in both ears, looking straight ahead is fine, turning head is nauseating, fatigue, long in depth dreams  He has no family history of vestibular disease   He states the medications that were given to him in the ED are not effective         No Known Allergies Previous Medications   DIAZEPAM (VALIUM) 2 MG TABLET    Take 1 tablet (2 mg total) by mouth every 12 (twelve) hours as needed (vertigo).   MECLIZINE (ANTIVERT) 25 MG TABLET    Take 1 tablet (25 mg total) by mouth 3 (three) times daily as needed for dizziness.   ONDANSETRON (ZOFRAN ODT) 4 MG DISINTEGRATING TABLET    Take 1 tablet (4 mg total) by mouth every 8 (eight) hours as needed for nausea or vomiting.    Review of Systems  Neurological: Positive for dizziness and weakness.  All other systems reviewed and are negative.   Social History   Tobacco Use  . Smoking status: Never Smoker  . Smokeless tobacco: Never Used  Substance Use Topics  . Alcohol use: Not Currently    Alcohol/week: 0.6 oz    Types: 1 Standard drinks or equivalent per week   Objective:   BP (!) 178/121 (BP Location: Left Arm, Patient Position: Standing, Cuff Size: Normal)   Pulse 97   Temp 98.3 F (36.8 C) (Oral)   Ht 5' 11.65" (1.82 m)   Wt 279 lb 9.6 oz (126.8 kg)   BMI 38.29 kg/m   Physical Exam Constitutional: Well nourished. Alert and oriented,  No acute distress. HEENT: [place] AT, moist mucus membranes. Trachea midline, no masses. Cardiovascular: No clubbing, cyanosis, or edema. Respiratory: Normal respiratory effort, no increased work of breathing. Skin: No rashes, bruises or suspicious lesions. Lymph: No cervical or inguinal adenopathy. Neurologic: Grossly intact, no focal deficits, moving all 4 extremities. Psychiatric: Normal mood and affect.      Assessment & Plan:     1. HTN Start HCTZ 12.5 mg daily add K-dur 10 mEq RTC in BP and potassium in 2 weeks  2. ED Viagra sent to medication management         [facility]   [facility] [place]

## 2018-06-08 ENCOUNTER — Encounter: Payer: Self-pay | Admitting: Licensed Clinical Social Worker

## 2018-06-20 ENCOUNTER — Encounter (INDEPENDENT_AMBULATORY_CARE_PROVIDER_SITE_OTHER): Payer: Self-pay

## 2018-06-20 ENCOUNTER — Ambulatory Visit: Payer: Self-pay | Admitting: Pharmacy Technician

## 2018-06-20 DIAGNOSIS — Z79899 Other long term (current) drug therapy: Secondary | ICD-10-CM

## 2018-06-20 NOTE — Progress Notes (Signed)
Completed [facility] application and contract.  Patient agreed to all terms of the [facility] contract.    Patient approved to receive medication assistance at [facility] as long as eligibility criteria continues to be met.    Provided patient with Civil engineer, contracting based on his particular needs.    Viagra Prescription Application completed with patient.  Forwarded to [facility] for signature.  Upon receipt of signed application from provider, Viagra Prescription Application will be submitted to [facility].  [facility]

## 2018-06-23 ENCOUNTER — Ambulatory Visit: Payer: Self-pay | Admitting: Urology

## 2018-06-23 VITALS — BP 158/107 | HR 94 | Temp 97.9°F | Ht 73.0 in | Wt 277.1 lb

## 2018-06-23 DIAGNOSIS — I10 Essential (primary) hypertension: Secondary | ICD-10-CM

## 2018-06-23 DIAGNOSIS — H6120 Impacted cerumen, unspecified ear: Secondary | ICD-10-CM

## 2018-06-23 MED ORDER — HYDROCHLOROTHIAZIDE 25 MG PO TABS: 25.0000 mg | ORAL_TABLET | Freq: Every day | ORAL | 0 refills | Status: AC

## 2018-06-23 NOTE — Progress Notes (Signed)
  Patient: Joshua Long Male    DOB: 1971-05-01   47 y.o.   MRN: [id number] Visit Date: 06/23/2018  Today's Provider: [facility]   Chief Complaint  Patient presents with  . Follow-up   Subjective:    HPI He has not tried any Viagra Abstained from caffeine Having urinary frequency Feels like he has an inner ear infection in his left ear  Still having episodes of dizziness   No Known Allergies Previous Medications   DIAZEPAM (VALIUM) 2 MG TABLET    Take 1 tablet (2 mg total) by mouth every 12 (twelve) hours as needed (vertigo).   HYDROCHLOROTHIAZIDE (MICROZIDE) 12.5 MG CAPSULE    Take 1 capsule (12.5 mg total) by mouth daily.   MECLIZINE (ANTIVERT) 25 MG TABLET    Take 1 tablet (25 mg total) by mouth 3 (three) times daily as needed for dizziness.   ONDANSETRON (ZOFRAN ODT) 4 MG DISINTEGRATING TABLET    Take 1 tablet (4 mg total) by mouth every 8 (eight) hours as needed for nausea or vomiting.   POTASSIUM CHLORIDE (K-DUR) 10 MEQ TABLET    Take 1 tablet (10 mEq total) by mouth daily.   SILDENAFIL (VIAGRA) 100 MG TABLET    Take 1 tablet (100 mg total) by mouth daily as needed for erectile dysfunction.    Review of Systems  Social History   Tobacco Use  . Smoking status: Never Smoker  . Smokeless tobacco: Never Used  Substance Use Topics  . Alcohol use: Not Currently    Alcohol/week: 1.0 standard drinks    Types: 1 Standard drinks or equivalent per week   Objective:   BP (!) 158/107 (BP Location: Left Arm, Patient Position: Sitting)   Pulse 94   Temp 97.9 F (36.6 C)   Ht 6\' 1"  (1.854 m)   Wt 277 lb 1.6 oz (125.7 kg)   BMI 36.56 kg/m   Physical Exam Constitutional: Well nourished. Alert and oriented, No acute distress. HEENT: [place] AT, moist mucus membranes. Trachea midline, no masses.  Right ear canal occluded with wax.  Left tympanic membrane clear  Cardiovascular: No clubbing, cyanosis, or edema. Respiratory: Normal respiratory effort, no increased work  of breathing. Skin: No rashes, bruises or suspicious lesions. Lymph: No cervical or inguinal adenopathy. Neurologic: Grossly intact, no focal deficits, moving all 4 extremities. Psychiatric: Normal mood and affect.      Assessment & Plan:  1. HTN  Patient is willing to increase his BP to 25 mg daily as he is not at goal for BP  BMP tonight  2. Vertigo  Cannot visualize the right tympanic membrane   Refer to ENT for ear wax removal and to evaluate for vertigo  3. ED   Has not tried Viagra   RTC in 2 weeks for BP and lab recheck      [facility]   [facility] [place]

## 2018-06-23 NOTE — Addendum Note (Signed)
Addended by: Orvis BrillGREEN, Atiya Yera M on: 06/23/2018 07:28 PM   Modules accepted: Orders

## 2018-06-24 LAB — BASIC METABOLIC PANEL
BUN / CREAT RATIO: 13 (ref 9–20)
BUN: 13 mg/dL (ref 6–24)
CHLORIDE: 102 mmol/L (ref 96–106)
CO2: 28 mmol/L (ref 20–29)
Calcium: 9.9 mg/dL (ref 8.7–10.2)
Creatinine, Ser: 0.97 mg/dL (ref 0.76–1.27)
GFR calc Af Amer: 107 mL/min/{1.73_m2} (ref >59)
GFR calc non Af Amer: 93 mL/min/{1.73_m2} (ref >59)
Glucose: 94 mg/dL (ref 65–99)
Potassium: 4 mmol/L (ref 3.5–5.2)
Sodium: 145 mmol/L — ABNORMAL HIGH (ref 134–144)

## 2018-06-29 ENCOUNTER — Telehealth: Payer: Self-pay

## 2018-06-29 NOTE — Telephone Encounter (Signed)
Referral sent to [facility]

## 2018-07-07 ENCOUNTER — Ambulatory Visit: Payer: Self-pay

## 2018-07-18 ENCOUNTER — Telehealth: Payer: Self-pay | Admitting: Pharmacy Technician

## 2018-07-18 NOTE — Telephone Encounter (Signed)
Have attempted to contact patient several times via phone.  Have left several messages for patient to return phone call.  Need clarification on bank statement before additional medication assistance can be provided.  Sherilyn Dacosta Care Manager [facility]

## 2018-08-02 ENCOUNTER — Ambulatory Visit: Payer: Self-pay

## 2018-09-05 ENCOUNTER — Telehealth: Payer: Self-pay | Admitting: Pharmacy Technician

## 2018-09-05 NOTE — Telephone Encounter (Signed)
Patient provided bank statements that showed payroll deposits from [employer].  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  Explained to patient that I needed a copy of paystubs.  Patient indicated that he does not receive paystubs because he does contract work.  Explained to patient that I need a copy of the contract.  Patient stated that he would see what he could do.  Explained that [facility] would not be able to provide additional medication assistance without verification of his monthly income.  Patient verbally acknowledged that he understood.  Sherilyn Dacosta Care Manager [facility]

## 2020-05-17 IMAGING — MR MR HEAD W/O CM
10 series · 48 of 48 positions shown · non-contrast
Comparison: None.

CLINICAL DATA: Dizziness and headache over the last 2 weeks.
Photosensitivity.

EXAM:
MRI HEAD WITHOUT CONTRAST
TECHNIQUE: Multiplanar, multiecho pulse sequences of the brain and surrounding
structures were obtained without intravenous contrast.

[Series 2: T1 · sagittal · 5.0mm · 0.45mm/px · 3 of 25 slices shown (1 of 2)]
[im 1/25]
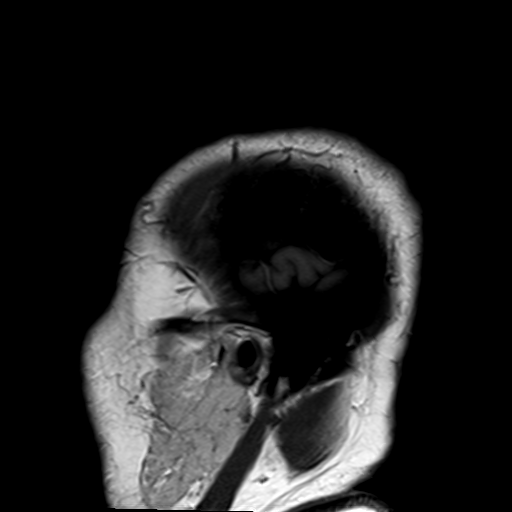
[im 13/25]
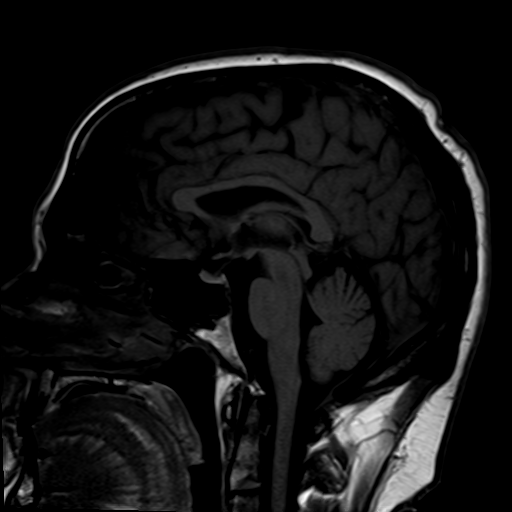
[im 25/25]
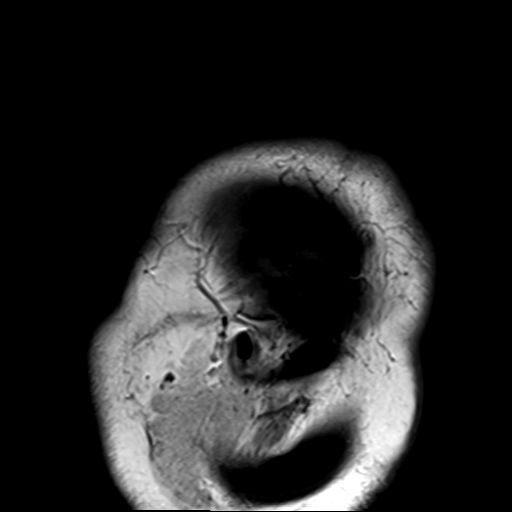

[Series 4: DWI · axial · 3.0mm · 1.80mm/px · z∈[-51,+107]mm · 5 of 55 slices shown (1 of 2)]
[im 1/55]
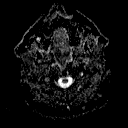
[im 14/55]
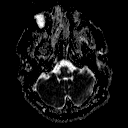
[im 28/55]
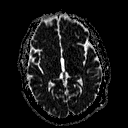
[im 41/55]
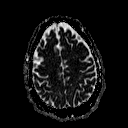
[im 55/55]
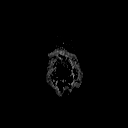

[Series 6: DWI · coronal · 3.0mm · 1.80mm/px · 4 of 45 slices shown (2 of 2)]
[im 1/45]
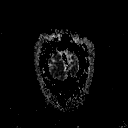
[im 15/45]
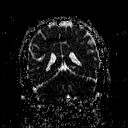
[im 30/45]
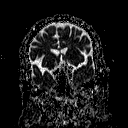
[im 45/45]
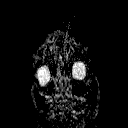

[Series 7: T2 · axial · 5.0mm · 0.60mm/px · z∈[-47,+105]mm · 2 of 25 slices shown (1 of 3)]
[im 1/25]
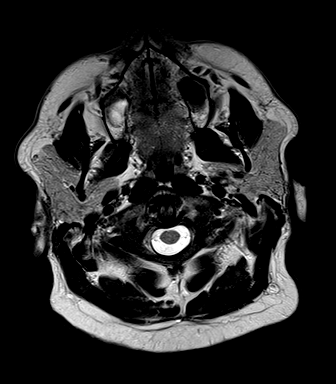
[im 25/25]
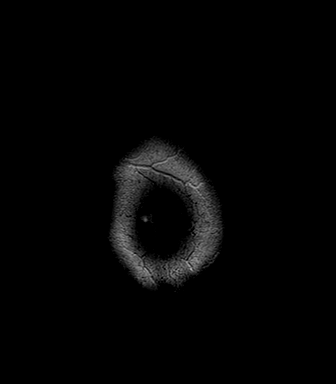

[Series 8: FLAIR · axial · 3.0mm · 0.45mm/px · z∈[-47,+105]mm · 5 of 53 slices shown]
[im 1/53]
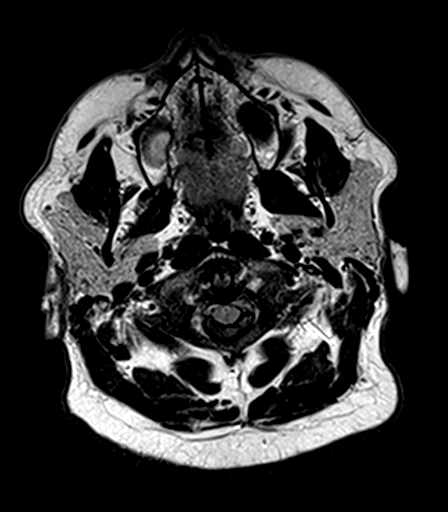
[im 14/53]
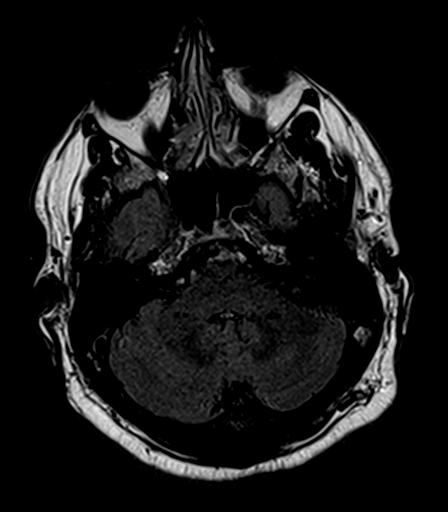
[im 27/53]
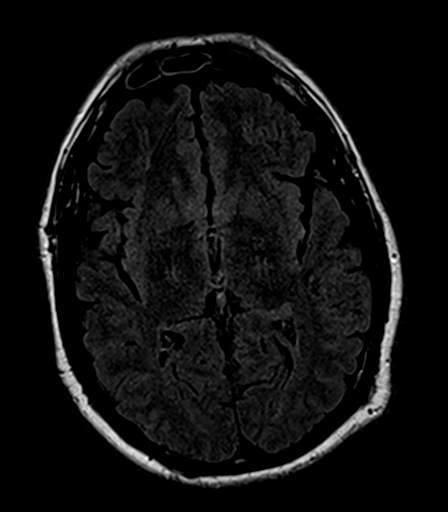
[im 40/53]
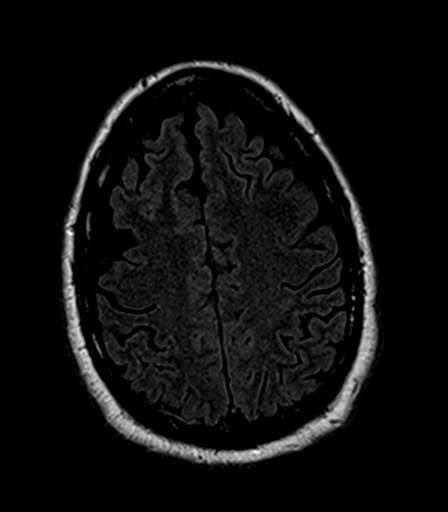
[im 53/53]
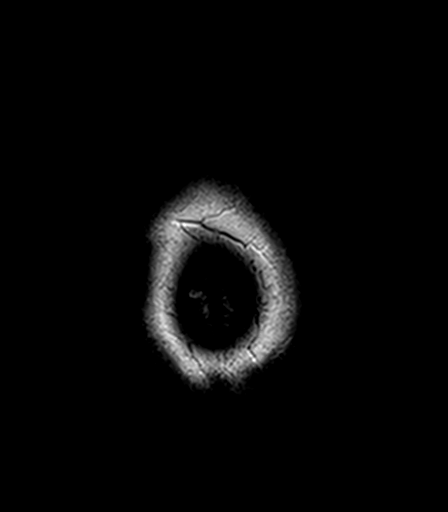

[Series 9: T2 · axial · 5.0mm · 0.45mm/px · z∈[-47,+105]mm · 2 of 25 slices shown (2 of 3)]
[im 1/25]
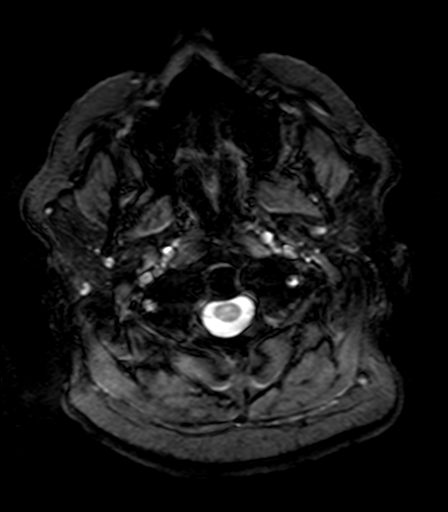
[im 25/25]
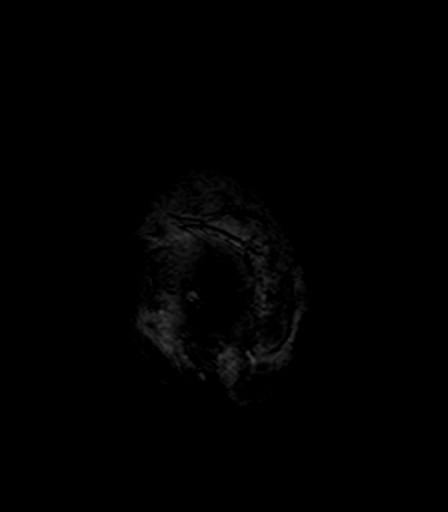

[Series 10: T1 · axial · 1.0mm · 1.00mm/px · z∈[-54,+117]mm · 16 of 176 slices shown (2 of 2)]
[im 1/176]
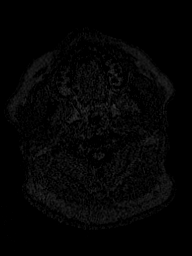
[im 12/176]
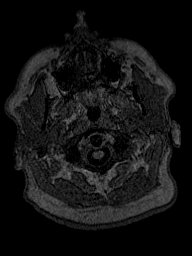
[im 24/176]
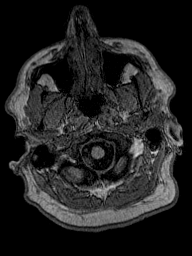
[im 36/176]
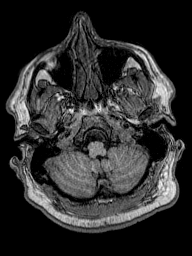
[im 47/176]
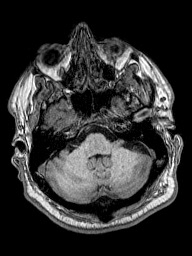
[im 59/176]
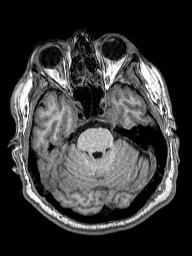
[im 71/176]
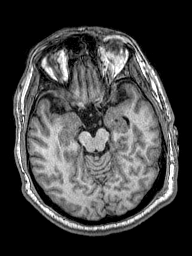
[im 82/176]
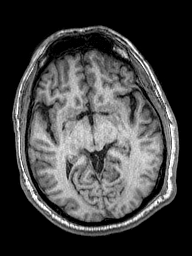
[im 94/176]
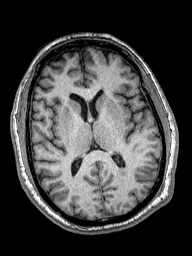
[im 106/176]
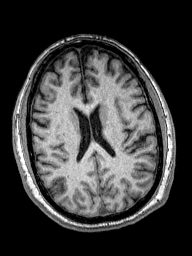
[im 117/176]
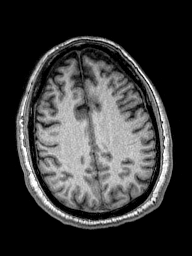
[im 129/176]
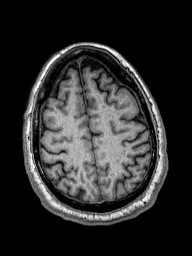
[im 141/176]
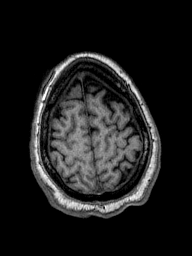
[im 152/176]
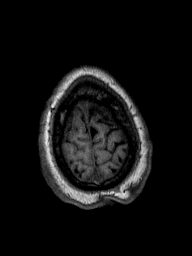
[im 164/176]
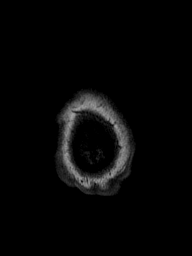
[im 176/176]
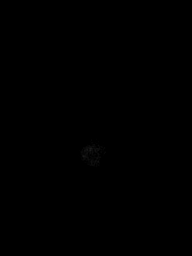

[Series 11: T2 · coronal · 5.0mm · 0.49mm/px · 2 of 27 slices shown (3 of 3)]
[im 1/27]
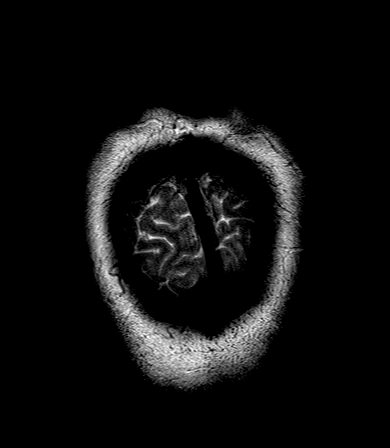
[im 27/27]
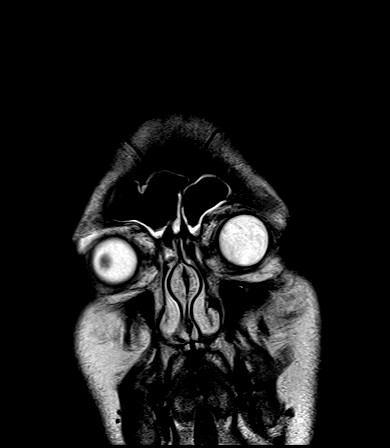

[Series 100: (id) · axial · 3.0mm · 1.80mm/px · z∈[-51,+104]mm · 5 of 54 slices shown]
[im 1/54]
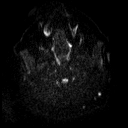
[im 14/54]
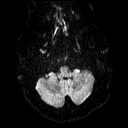
[im 27/54]
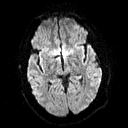
[im 40/54]
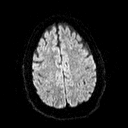
[im 54/54]
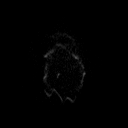

[Series 101: (id) cor · coronal · 3.0mm · 1.80mm/px · 4 of 45 slices shown]
[im 1/45]
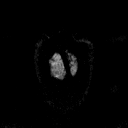
[im 15/45]
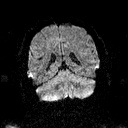
[im 30/45]
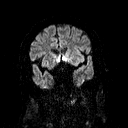
[im 45/45]
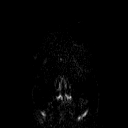

[48 of 48 positions shown; findings below may reference images not displayed]

FINDINGS: Brain: Brain has normal appearance without evidence of malformation,
atrophy, old or acute small or large vessel infarction, mass lesion,
hemorrhage, hydrocephalus or extra-axial collection.

Vascular: Major vessels at the base of the brain show flow. Venous
sinuses appear patent.

Skull and upper cervical spine: Normal.

Sinuses/Orbits: Minimal mucosal inflammatory changes of the frontal
and ethmoid sinuses. Orbits negative..

Other: None significant.
IMPRESSION: Normal appearance of the brain itself. There are mild mucosal
inflammatory changes of the ethmoid and frontal sinuses.
